# Patient Record
Sex: Female | Born: 1940 | Race: White | Hispanic: No | Marital: Single | Smoking: Current every day smoker
Health system: Southern US, Community
[De-identification: ages and names within clinical notes are randomized; demographics above are authoritative.]

## PROBLEM LIST (undated history)

## (undated) DIAGNOSIS — E0500 Thyrotoxicosis with diffuse goiter without thyrotoxic crisis or storm: Secondary | ICD-10-CM

## (undated) DIAGNOSIS — I10 Essential (primary) hypertension: Secondary | ICD-10-CM

## (undated) DIAGNOSIS — M818 Other osteoporosis without current pathological fracture: Secondary | ICD-10-CM

## (undated) HISTORY — PX: ABDOMINAL HYSTERECTOMY: SHX81

---

## 1998-11-18 ENCOUNTER — Other Ambulatory Visit: Admission: RE | Admit: 1998-11-18 | Discharge: 1998-11-18 | Payer: Self-pay | Admitting: Internal Medicine

## 2000-01-26 ENCOUNTER — Other Ambulatory Visit: Admission: RE | Admit: 2000-01-26 | Discharge: 2000-01-26 | Payer: Self-pay | Admitting: Internal Medicine

## 2001-02-17 ENCOUNTER — Other Ambulatory Visit: Admission: RE | Admit: 2001-02-17 | Discharge: 2001-02-17 | Payer: Self-pay | Admitting: Internal Medicine

## 2004-07-31 ENCOUNTER — Ambulatory Visit (HOSPITAL_COMMUNITY): Admission: RE | Admit: 2004-07-31 | Discharge: 2004-07-31 | Payer: Self-pay | Admitting: Gastroenterology

## 2004-07-31 ENCOUNTER — Encounter (INDEPENDENT_AMBULATORY_CARE_PROVIDER_SITE_OTHER): Payer: Self-pay | Admitting: *Deleted

## 2006-08-08 ENCOUNTER — Encounter (INDEPENDENT_AMBULATORY_CARE_PROVIDER_SITE_OTHER): Payer: Self-pay | Admitting: Specialist

## 2006-08-08 ENCOUNTER — Ambulatory Visit (HOSPITAL_COMMUNITY): Admission: RE | Admit: 2006-08-08 | Discharge: 2006-08-09 | Payer: Self-pay | Admitting: Obstetrics & Gynecology

## 2008-07-26 ENCOUNTER — Encounter: Admission: RE | Admit: 2008-07-26 | Discharge: 2008-07-26 | Payer: Self-pay | Admitting: Internal Medicine

## 2010-08-25 NOTE — Op Note (Signed)
NAME:  Sandra Brock, Sandra Brock                 ACCOUNT NO.:  [id number]   MEDICAL RECORD NO.:  [id number]          PATIENT TYPE:  AMB   LOCATION:  ENDO                         FACILITY:  [facility]   PHYSICIAN:  Bernette Redbird, M.D.   DATE OF BIRTH:  1940-11-01   DATE OF PROCEDURE:  07/31/2004  DATE OF DISCHARGE:                                 OPERATIVE REPORT   PROCEDURE:  Colonoscopy with biopsy.   ENDOSCOPIST:  Bernette Redbird, M.D.   INDICATIONS:  [age]-year-old for initial colon cancer screening exam.  No worrisome risk factors or symptoms.   FINDINGS:  Diminutive polyp.  Mild diverticulosis.   PROCEDURE:  The nature, purpose and risks of the procedure had been  discussed with the patient, who provided written consent.  Sedation was  fentanyl 60 mcg and Versed 6 mg IV without arrhythmias or desaturation.  The  Olympus adjustable-tension pediatric video colonoscope was advanced without  significant difficulty most of the way around the colon and then external  abdominal compression was used to enter the base of the cecum as identified  by visualization of the appendiceal orifice and pullback was then performed.  The quality of prep was excellent and it was felt that all areas were well-  seen.   There was a 3-mm sessile polyp at 35 cm removed by couple of cold biopsies.  No other polyps were seen and there was no evidence of cancer, colitis or  vascular malformations.  There was some mild scattered diverticulosis.  Retroflexion the rectum was unremarkable.   The patient tolerated the procedure well and there were no apparent  complications.   IMPRESSION:  1.  Solitary diminutive colon polyp removed as described above (211.3).  2.  Minimal diverticulosis.   PLAN:  Await pathology results.      RB/MEDQ  D:  07/31/2004  T:  08/01/2004  Job:  [id number]   cc:   Merlene Laughter. Renae Gloss, M.D.  [address]  [address]  [place]  [place] [address]  Fax: [phone]

## 2010-08-25 NOTE — Op Note (Signed)
NAME:  Sandra Brock, Sandra Brock                 ACCOUNT NO.:  [id number]   MEDICAL RECORD NO.:  [id number]          PATIENT TYPE:  AMB   LOCATION:  SDC                           FACILITY:  [facility]   PHYSICIAN:  Genia Del, M.D.DATE OF BIRTH:  1940/08/27   DATE OF PROCEDURE:  08/08/2006  DATE OF DISCHARGE:                               OPERATIVE REPORT   PREOPERATIVE DIAGNOSIS:  Symptomatic uterine prolapse, grade 2/3 and  cystocele grade 4/4.   POSTOPERATIVE DIAGNOSIS:  Symptomatic uterine prolapse, grade 2/3 and  cystocele grade 4/4.   PROCEDURE:  A vaginal hysterectomy with anterior repair.   SURGEON:  Dr. Genia Del   ASSISTANT:  Maxie Better, M.D.   ANESTHESIOLOGIST:  Dr. Cristela Blue.   PROCEDURE:  Under general anesthesia with endotracheal intubation the  patient is in lithotomy position.  She is prepped with Betadine on the  suprapubic, vulvar and vaginal areas and draped as usual.  The bladder  catheter with a Foley is put in place. The vaginal exam reveals of the  grade 2/3 uterine prolapse, small uterus.  No adnexal mass felt.  The  cystocele is grade 4/4 and no rectocele.  We put the weighted speculum  in place and the anterior retractor.  We grasped the cervix with two  tenaculums, infiltrate circumferentially at the junction of the cervix  and vaginal mucosa with lidocaine 1% with epinephrine, 20 mL are  injected. We then make a circumferential incision at that level with the  electrocautery cutting mode.  We then retract the vaginal mucosa from  the cervix posteriorly and opened the peritoneum with the [facility] scissors  at that level.  We changed the weighted speculum to a long narrow one  posteriorly.  We then go anteriorly and retract the vaginal mucosa from  the cervix at that level.  We opened the peritoneum with the [facility]  scissors and reposition the retractor to push the bladder away from the  surgical area.  We then used the curved Heaney on the left side.   We  clamped and sectioned with Mayo scissors and suture with a Heaney stitch  of Vicryl 0. The uterosacral and cardinal ligaments which are very  faint, we keep that on a hemostat.  We did the same thing on the right  side.  We then use the gyrus to cauterize the right uterine artery.  We  then sectioned with Jen Mow scissors.  We proceed the same way on the left  side.  We continued to go up the uterus, cauterizing with the gyrus and  sectioning with Metz scissors until the uterus is completely detached  and sent to pathology.  Hemostasis is good at all levels.  We do not see  the ovaries very well but no masses palpated and they are very high, so  the decision is taken to leave them behind. We verify hemostasis on all  pedicles, it is adequate.  We therefore continued with the anterior  repair.  We put Allis on the anterior vaginal mucosa, we used a [place]  to dissect just below the mucosa and section  in a straight line starting  at the vaginal cuff to about 2 cm of the urethral meatus. We used [employer]'  as we go along. We then used the scalpel and then the [facility] to release  the mucosa from the bladder.  We descend the bladder all around.  We  then use a Vicryl 2-0 in a circular fashion to reduce the cystocele and  finished with Vicryl 2-0 from side-to-side to completely reduce the  cystocele.  We then resect the vaginal mucosa on each side with straight  scissors.  We closed the vaginal mucosa with a locked running stitch of  Vicryl 3-0. Hemostasis is good at all levels.  We then closed the  vaginal cuff with separate stitches of Vicryl 0 from anterior to  posterior. As we go along we attach the two uterosacral ligaments  together in the midline.  The vagina is completely closed.  Hemostasis  is good.  Note that a locked running suture of Vicryl 0 was done on the  posterior vaginal mucosa before proceeding with the cystocele repair. We  are satisfied with the cystocele reduction.   Hemostasis is good at all levels, no rectocele is present.  We therefore  removed all instruments.  We apply some Estrace in the vagina to help  healing. The estimated blood loss was 50 mL.  The counts of sponges and  instruments was complete. The patient was brought to recovery room in  good stable status.      Genia Del, M.D.  Electronically Signed     ML/MEDQ  D:  08/08/2006  T:  08/08/2006  Job:  [id number]

## 2011-04-17 DIAGNOSIS — Z1231 Encounter for screening mammogram for malignant neoplasm of breast: Secondary | ICD-10-CM | POA: Diagnosis not present

## 2011-04-24 DIAGNOSIS — R928 Other abnormal and inconclusive findings on diagnostic imaging of breast: Secondary | ICD-10-CM | POA: Diagnosis not present

## 2011-04-24 DIAGNOSIS — Z09 Encounter for follow-up examination after completed treatment for conditions other than malignant neoplasm: Secondary | ICD-10-CM | POA: Diagnosis not present

## 2011-08-15 ENCOUNTER — Other Ambulatory Visit (HOSPITAL_COMMUNITY): Payer: Self-pay | Admitting: Endocrinology

## 2011-08-15 DIAGNOSIS — E0590 Thyrotoxicosis, unspecified without thyrotoxic crisis or storm: Secondary | ICD-10-CM

## 2011-08-15 DIAGNOSIS — E0500 Thyrotoxicosis with diffuse goiter without thyrotoxic crisis or storm: Secondary | ICD-10-CM | POA: Diagnosis not present

## 2011-08-20 ENCOUNTER — Encounter (HOSPITAL_COMMUNITY)
Admission: RE | Admit: 2011-08-20 | Discharge: 2011-08-20 | Disposition: A | Discharge status: Home / Self Care | Payer: Medicare Other | Source: Ambulatory Visit | Attending: Endocrinology | Admitting: Endocrinology

## 2011-08-20 DIAGNOSIS — E041 Nontoxic single thyroid nodule: Secondary | ICD-10-CM | POA: Diagnosis not present

## 2011-08-20 DIAGNOSIS — E0590 Thyrotoxicosis, unspecified without thyrotoxic crisis or storm: Secondary | ICD-10-CM | POA: Diagnosis not present

## 2011-08-20 DIAGNOSIS — E0510 Thyrotoxicosis with toxic single thyroid nodule without thyrotoxic crisis or storm: Secondary | ICD-10-CM | POA: Insufficient documentation

## 2011-08-21 ENCOUNTER — Encounter (HOSPITAL_COMMUNITY)
Admission: RE | Admit: 2011-08-21 | Discharge: 2011-08-21 | Disposition: A | Discharge status: Home / Self Care | Payer: Medicare Other | Source: Ambulatory Visit | Attending: Endocrinology | Admitting: Endocrinology

## 2011-08-21 DIAGNOSIS — E0510 Thyrotoxicosis with toxic single thyroid nodule without thyrotoxic crisis or storm: Secondary | ICD-10-CM | POA: Diagnosis not present

## 2011-08-21 MED ORDER — SODIUM IODIDE I 131 CAPSULE
8.5000 | Freq: Once | INTRAVENOUS | Status: AC | PRN
  Administered 2011-08-20: 8.5 via ORAL

## 2011-08-21 MED ORDER — SODIUM PERTECHNETATE TC 99M INJECTION
10.3000 | Freq: Once | INTRAVENOUS | Status: AC | PRN
  Administered 2011-08-21: 10 via INTRAVENOUS

## 2011-08-23 ENCOUNTER — Other Ambulatory Visit (HOSPITAL_COMMUNITY): Payer: Self-pay | Admitting: Endocrinology

## 2011-08-23 DIAGNOSIS — E0500 Thyrotoxicosis with diffuse goiter without thyrotoxic crisis or storm: Secondary | ICD-10-CM

## 2011-08-30 ENCOUNTER — Ambulatory Visit (HOSPITAL_COMMUNITY): Payer: Medicare Other

## 2011-09-13 DIAGNOSIS — E061 Subacute thyroiditis: Secondary | ICD-10-CM | POA: Diagnosis not present

## 2011-09-13 DIAGNOSIS — Z79899 Other long term (current) drug therapy: Secondary | ICD-10-CM | POA: Diagnosis not present

## 2011-09-13 DIAGNOSIS — Z0000 Encounter for general adult medical examination without abnormal findings: Secondary | ICD-10-CM | POA: Diagnosis not present

## 2011-09-13 DIAGNOSIS — I10 Essential (primary) hypertension: Secondary | ICD-10-CM | POA: Diagnosis not present

## 2011-09-13 DIAGNOSIS — E559 Vitamin D deficiency, unspecified: Secondary | ICD-10-CM | POA: Diagnosis not present

## 2011-09-13 DIAGNOSIS — R7309 Other abnormal glucose: Secondary | ICD-10-CM | POA: Diagnosis not present

## 2011-09-14 ENCOUNTER — Encounter (HOSPITAL_COMMUNITY): Payer: Self-pay

## 2011-09-14 ENCOUNTER — Encounter (HOSPITAL_COMMUNITY)
Admission: RE | Admit: 2011-09-14 | Discharge: 2011-09-14 | Disposition: A | Discharge status: Home / Self Care | Payer: Medicare Other | Source: Ambulatory Visit | Attending: Endocrinology | Admitting: Endocrinology

## 2011-09-14 DIAGNOSIS — E0590 Thyrotoxicosis, unspecified without thyrotoxic crisis or storm: Secondary | ICD-10-CM | POA: Diagnosis not present

## 2011-09-14 DIAGNOSIS — E0510 Thyrotoxicosis with toxic single thyroid nodule without thyrotoxic crisis or storm: Secondary | ICD-10-CM | POA: Insufficient documentation

## 2011-09-14 DIAGNOSIS — E041 Nontoxic single thyroid nodule: Secondary | ICD-10-CM | POA: Diagnosis not present

## 2011-09-14 DIAGNOSIS — E0500 Thyrotoxicosis with diffuse goiter without thyrotoxic crisis or storm: Secondary | ICD-10-CM

## 2011-09-14 HISTORY — DX: Thyrotoxicosis with diffuse goiter without thyrotoxic crisis or storm: E05.00

## 2011-09-14 MED ORDER — SODIUM IODIDE I 131 CAPSULE
29.9000 | Freq: Once | INTRAVENOUS | Status: AC | PRN
  Administered 2011-09-14: 29.9 via ORAL

## 2011-09-20 DIAGNOSIS — E0500 Thyrotoxicosis with diffuse goiter without thyrotoxic crisis or storm: Secondary | ICD-10-CM | POA: Diagnosis not present

## 2011-11-21 DIAGNOSIS — E0500 Thyrotoxicosis with diffuse goiter without thyrotoxic crisis or storm: Secondary | ICD-10-CM | POA: Diagnosis not present

## 2011-11-23 DIAGNOSIS — E0500 Thyrotoxicosis with diffuse goiter without thyrotoxic crisis or storm: Secondary | ICD-10-CM | POA: Diagnosis not present

## 2012-01-25 DIAGNOSIS — E0500 Thyrotoxicosis with diffuse goiter without thyrotoxic crisis or storm: Secondary | ICD-10-CM | POA: Diagnosis not present

## 2012-02-04 DIAGNOSIS — H2510 Age-related nuclear cataract, unspecified eye: Secondary | ICD-10-CM | POA: Diagnosis not present

## 2012-03-14 DIAGNOSIS — Z79899 Other long term (current) drug therapy: Secondary | ICD-10-CM | POA: Diagnosis not present

## 2012-03-14 DIAGNOSIS — M6281 Muscle weakness (generalized): Secondary | ICD-10-CM | POA: Diagnosis not present

## 2012-03-14 DIAGNOSIS — I10 Essential (primary) hypertension: Secondary | ICD-10-CM | POA: Diagnosis not present

## 2012-03-14 DIAGNOSIS — E0520 Thyrotoxicosis with toxic multinodular goiter without thyrotoxic crisis or storm: Secondary | ICD-10-CM | POA: Diagnosis not present

## 2012-05-05 DIAGNOSIS — Z1231 Encounter for screening mammogram for malignant neoplasm of breast: Secondary | ICD-10-CM | POA: Diagnosis not present

## 2012-05-26 DIAGNOSIS — E0500 Thyrotoxicosis with diffuse goiter without thyrotoxic crisis or storm: Secondary | ICD-10-CM | POA: Diagnosis not present

## 2012-08-25 DIAGNOSIS — M25559 Pain in unspecified hip: Secondary | ICD-10-CM | POA: Diagnosis not present

## 2012-08-25 DIAGNOSIS — Q762 Congenital spondylolisthesis: Secondary | ICD-10-CM | POA: Diagnosis not present

## 2012-08-25 DIAGNOSIS — IMO0002 Reserved for concepts with insufficient information to code with codable children: Secondary | ICD-10-CM | POA: Diagnosis not present

## 2012-08-25 DIAGNOSIS — M999 Biomechanical lesion, unspecified: Secondary | ICD-10-CM | POA: Diagnosis not present

## 2012-08-25 DIAGNOSIS — M5137 Other intervertebral disc degeneration, lumbosacral region: Secondary | ICD-10-CM | POA: Diagnosis not present

## 2012-08-26 DIAGNOSIS — M999 Biomechanical lesion, unspecified: Secondary | ICD-10-CM | POA: Diagnosis not present

## 2012-08-26 DIAGNOSIS — Q762 Congenital spondylolisthesis: Secondary | ICD-10-CM | POA: Diagnosis not present

## 2012-08-26 DIAGNOSIS — M5137 Other intervertebral disc degeneration, lumbosacral region: Secondary | ICD-10-CM | POA: Diagnosis not present

## 2012-08-26 DIAGNOSIS — M25559 Pain in unspecified hip: Secondary | ICD-10-CM | POA: Diagnosis not present

## 2012-08-26 DIAGNOSIS — IMO0002 Reserved for concepts with insufficient information to code with codable children: Secondary | ICD-10-CM | POA: Diagnosis not present

## 2012-08-27 DIAGNOSIS — M5137 Other intervertebral disc degeneration, lumbosacral region: Secondary | ICD-10-CM | POA: Diagnosis not present

## 2012-08-27 DIAGNOSIS — IMO0002 Reserved for concepts with insufficient information to code with codable children: Secondary | ICD-10-CM | POA: Diagnosis not present

## 2012-08-27 DIAGNOSIS — M25559 Pain in unspecified hip: Secondary | ICD-10-CM | POA: Diagnosis not present

## 2012-08-27 DIAGNOSIS — M999 Biomechanical lesion, unspecified: Secondary | ICD-10-CM | POA: Diagnosis not present

## 2012-08-27 DIAGNOSIS — Q762 Congenital spondylolisthesis: Secondary | ICD-10-CM | POA: Diagnosis not present

## 2012-08-29 DIAGNOSIS — Q762 Congenital spondylolisthesis: Secondary | ICD-10-CM | POA: Diagnosis not present

## 2012-08-29 DIAGNOSIS — IMO0002 Reserved for concepts with insufficient information to code with codable children: Secondary | ICD-10-CM | POA: Diagnosis not present

## 2012-08-29 DIAGNOSIS — M999 Biomechanical lesion, unspecified: Secondary | ICD-10-CM | POA: Diagnosis not present

## 2012-08-29 DIAGNOSIS — M5137 Other intervertebral disc degeneration, lumbosacral region: Secondary | ICD-10-CM | POA: Diagnosis not present

## 2012-08-29 DIAGNOSIS — M25559 Pain in unspecified hip: Secondary | ICD-10-CM | POA: Diagnosis not present

## 2012-09-03 DIAGNOSIS — M5137 Other intervertebral disc degeneration, lumbosacral region: Secondary | ICD-10-CM | POA: Diagnosis not present

## 2012-09-03 DIAGNOSIS — M25559 Pain in unspecified hip: Secondary | ICD-10-CM | POA: Diagnosis not present

## 2012-09-03 DIAGNOSIS — Q762 Congenital spondylolisthesis: Secondary | ICD-10-CM | POA: Diagnosis not present

## 2012-09-03 DIAGNOSIS — IMO0002 Reserved for concepts with insufficient information to code with codable children: Secondary | ICD-10-CM | POA: Diagnosis not present

## 2012-09-03 DIAGNOSIS — M999 Biomechanical lesion, unspecified: Secondary | ICD-10-CM | POA: Diagnosis not present

## 2012-09-05 DIAGNOSIS — IMO0002 Reserved for concepts with insufficient information to code with codable children: Secondary | ICD-10-CM | POA: Diagnosis not present

## 2012-09-05 DIAGNOSIS — M5137 Other intervertebral disc degeneration, lumbosacral region: Secondary | ICD-10-CM | POA: Diagnosis not present

## 2012-09-05 DIAGNOSIS — M999 Biomechanical lesion, unspecified: Secondary | ICD-10-CM | POA: Diagnosis not present

## 2012-09-05 DIAGNOSIS — Q762 Congenital spondylolisthesis: Secondary | ICD-10-CM | POA: Diagnosis not present

## 2012-09-05 DIAGNOSIS — M25559 Pain in unspecified hip: Secondary | ICD-10-CM | POA: Diagnosis not present

## 2012-09-08 DIAGNOSIS — M25559 Pain in unspecified hip: Secondary | ICD-10-CM | POA: Diagnosis not present

## 2012-09-08 DIAGNOSIS — M5137 Other intervertebral disc degeneration, lumbosacral region: Secondary | ICD-10-CM | POA: Diagnosis not present

## 2012-09-08 DIAGNOSIS — Q762 Congenital spondylolisthesis: Secondary | ICD-10-CM | POA: Diagnosis not present

## 2012-09-08 DIAGNOSIS — IMO0002 Reserved for concepts with insufficient information to code with codable children: Secondary | ICD-10-CM | POA: Diagnosis not present

## 2012-09-08 DIAGNOSIS — M999 Biomechanical lesion, unspecified: Secondary | ICD-10-CM | POA: Diagnosis not present

## 2012-09-09 DIAGNOSIS — M5137 Other intervertebral disc degeneration, lumbosacral region: Secondary | ICD-10-CM | POA: Diagnosis not present

## 2012-09-09 DIAGNOSIS — M25559 Pain in unspecified hip: Secondary | ICD-10-CM | POA: Diagnosis not present

## 2012-09-09 DIAGNOSIS — Q762 Congenital spondylolisthesis: Secondary | ICD-10-CM | POA: Diagnosis not present

## 2012-09-09 DIAGNOSIS — M999 Biomechanical lesion, unspecified: Secondary | ICD-10-CM | POA: Diagnosis not present

## 2012-09-09 DIAGNOSIS — IMO0002 Reserved for concepts with insufficient information to code with codable children: Secondary | ICD-10-CM | POA: Diagnosis not present

## 2012-09-10 DIAGNOSIS — M25559 Pain in unspecified hip: Secondary | ICD-10-CM | POA: Diagnosis not present

## 2012-09-10 DIAGNOSIS — M5137 Other intervertebral disc degeneration, lumbosacral region: Secondary | ICD-10-CM | POA: Diagnosis not present

## 2012-09-10 DIAGNOSIS — IMO0002 Reserved for concepts with insufficient information to code with codable children: Secondary | ICD-10-CM | POA: Diagnosis not present

## 2012-09-10 DIAGNOSIS — Q762 Congenital spondylolisthesis: Secondary | ICD-10-CM | POA: Diagnosis not present

## 2012-09-10 DIAGNOSIS — M999 Biomechanical lesion, unspecified: Secondary | ICD-10-CM | POA: Diagnosis not present

## 2012-09-15 DIAGNOSIS — IMO0002 Reserved for concepts with insufficient information to code with codable children: Secondary | ICD-10-CM | POA: Diagnosis not present

## 2012-09-15 DIAGNOSIS — Q762 Congenital spondylolisthesis: Secondary | ICD-10-CM | POA: Diagnosis not present

## 2012-09-15 DIAGNOSIS — M5137 Other intervertebral disc degeneration, lumbosacral region: Secondary | ICD-10-CM | POA: Diagnosis not present

## 2012-09-15 DIAGNOSIS — M25559 Pain in unspecified hip: Secondary | ICD-10-CM | POA: Diagnosis not present

## 2012-09-15 DIAGNOSIS — M999 Biomechanical lesion, unspecified: Secondary | ICD-10-CM | POA: Diagnosis not present

## 2012-09-16 DIAGNOSIS — M5137 Other intervertebral disc degeneration, lumbosacral region: Secondary | ICD-10-CM | POA: Diagnosis not present

## 2012-09-16 DIAGNOSIS — IMO0002 Reserved for concepts with insufficient information to code with codable children: Secondary | ICD-10-CM | POA: Diagnosis not present

## 2012-09-16 DIAGNOSIS — M999 Biomechanical lesion, unspecified: Secondary | ICD-10-CM | POA: Diagnosis not present

## 2012-09-16 DIAGNOSIS — Q762 Congenital spondylolisthesis: Secondary | ICD-10-CM | POA: Diagnosis not present

## 2012-09-16 DIAGNOSIS — M25559 Pain in unspecified hip: Secondary | ICD-10-CM | POA: Diagnosis not present

## 2012-09-17 DIAGNOSIS — IMO0002 Reserved for concepts with insufficient information to code with codable children: Secondary | ICD-10-CM | POA: Diagnosis not present

## 2012-09-17 DIAGNOSIS — M999 Biomechanical lesion, unspecified: Secondary | ICD-10-CM | POA: Diagnosis not present

## 2012-09-17 DIAGNOSIS — Q762 Congenital spondylolisthesis: Secondary | ICD-10-CM | POA: Diagnosis not present

## 2012-09-17 DIAGNOSIS — M5137 Other intervertebral disc degeneration, lumbosacral region: Secondary | ICD-10-CM | POA: Diagnosis not present

## 2012-09-17 DIAGNOSIS — M25559 Pain in unspecified hip: Secondary | ICD-10-CM | POA: Diagnosis not present

## 2012-09-22 DIAGNOSIS — M25559 Pain in unspecified hip: Secondary | ICD-10-CM | POA: Diagnosis not present

## 2012-09-22 DIAGNOSIS — M5137 Other intervertebral disc degeneration, lumbosacral region: Secondary | ICD-10-CM | POA: Diagnosis not present

## 2012-09-22 DIAGNOSIS — Q762 Congenital spondylolisthesis: Secondary | ICD-10-CM | POA: Diagnosis not present

## 2012-09-22 DIAGNOSIS — M999 Biomechanical lesion, unspecified: Secondary | ICD-10-CM | POA: Diagnosis not present

## 2012-09-22 DIAGNOSIS — IMO0002 Reserved for concepts with insufficient information to code with codable children: Secondary | ICD-10-CM | POA: Diagnosis not present

## 2012-09-25 DIAGNOSIS — Q762 Congenital spondylolisthesis: Secondary | ICD-10-CM | POA: Diagnosis not present

## 2012-09-25 DIAGNOSIS — M5137 Other intervertebral disc degeneration, lumbosacral region: Secondary | ICD-10-CM | POA: Diagnosis not present

## 2012-09-25 DIAGNOSIS — M999 Biomechanical lesion, unspecified: Secondary | ICD-10-CM | POA: Diagnosis not present

## 2012-09-25 DIAGNOSIS — IMO0002 Reserved for concepts with insufficient information to code with codable children: Secondary | ICD-10-CM | POA: Diagnosis not present

## 2012-09-25 DIAGNOSIS — M25559 Pain in unspecified hip: Secondary | ICD-10-CM | POA: Diagnosis not present

## 2012-09-29 DIAGNOSIS — M5137 Other intervertebral disc degeneration, lumbosacral region: Secondary | ICD-10-CM | POA: Diagnosis not present

## 2012-09-29 DIAGNOSIS — M25559 Pain in unspecified hip: Secondary | ICD-10-CM | POA: Diagnosis not present

## 2012-09-29 DIAGNOSIS — Q762 Congenital spondylolisthesis: Secondary | ICD-10-CM | POA: Diagnosis not present

## 2012-09-29 DIAGNOSIS — M999 Biomechanical lesion, unspecified: Secondary | ICD-10-CM | POA: Diagnosis not present

## 2012-09-29 DIAGNOSIS — IMO0002 Reserved for concepts with insufficient information to code with codable children: Secondary | ICD-10-CM | POA: Diagnosis not present

## 2012-10-02 DIAGNOSIS — M5137 Other intervertebral disc degeneration, lumbosacral region: Secondary | ICD-10-CM | POA: Diagnosis not present

## 2012-10-02 DIAGNOSIS — Q762 Congenital spondylolisthesis: Secondary | ICD-10-CM | POA: Diagnosis not present

## 2012-10-02 DIAGNOSIS — M999 Biomechanical lesion, unspecified: Secondary | ICD-10-CM | POA: Diagnosis not present

## 2012-10-02 DIAGNOSIS — IMO0002 Reserved for concepts with insufficient information to code with codable children: Secondary | ICD-10-CM | POA: Diagnosis not present

## 2012-10-02 DIAGNOSIS — M25559 Pain in unspecified hip: Secondary | ICD-10-CM | POA: Diagnosis not present

## 2012-10-07 DIAGNOSIS — IMO0002 Reserved for concepts with insufficient information to code with codable children: Secondary | ICD-10-CM | POA: Diagnosis not present

## 2012-10-07 DIAGNOSIS — M25559 Pain in unspecified hip: Secondary | ICD-10-CM | POA: Diagnosis not present

## 2012-10-07 DIAGNOSIS — M999 Biomechanical lesion, unspecified: Secondary | ICD-10-CM | POA: Diagnosis not present

## 2012-10-07 DIAGNOSIS — Q762 Congenital spondylolisthesis: Secondary | ICD-10-CM | POA: Diagnosis not present

## 2012-10-07 DIAGNOSIS — M5137 Other intervertebral disc degeneration, lumbosacral region: Secondary | ICD-10-CM | POA: Diagnosis not present

## 2012-10-13 DIAGNOSIS — M999 Biomechanical lesion, unspecified: Secondary | ICD-10-CM | POA: Diagnosis not present

## 2012-10-13 DIAGNOSIS — M25559 Pain in unspecified hip: Secondary | ICD-10-CM | POA: Diagnosis not present

## 2012-10-13 DIAGNOSIS — M5137 Other intervertebral disc degeneration, lumbosacral region: Secondary | ICD-10-CM | POA: Diagnosis not present

## 2012-10-13 DIAGNOSIS — IMO0002 Reserved for concepts with insufficient information to code with codable children: Secondary | ICD-10-CM | POA: Diagnosis not present

## 2012-10-13 DIAGNOSIS — Q762 Congenital spondylolisthesis: Secondary | ICD-10-CM | POA: Diagnosis not present

## 2012-10-22 DIAGNOSIS — M999 Biomechanical lesion, unspecified: Secondary | ICD-10-CM | POA: Diagnosis not present

## 2012-10-22 DIAGNOSIS — IMO0002 Reserved for concepts with insufficient information to code with codable children: Secondary | ICD-10-CM | POA: Diagnosis not present

## 2012-10-22 DIAGNOSIS — Q762 Congenital spondylolisthesis: Secondary | ICD-10-CM | POA: Diagnosis not present

## 2012-10-22 DIAGNOSIS — M5137 Other intervertebral disc degeneration, lumbosacral region: Secondary | ICD-10-CM | POA: Diagnosis not present

## 2012-10-22 DIAGNOSIS — M25559 Pain in unspecified hip: Secondary | ICD-10-CM | POA: Diagnosis not present

## 2012-11-10 DIAGNOSIS — IMO0002 Reserved for concepts with insufficient information to code with codable children: Secondary | ICD-10-CM | POA: Diagnosis not present

## 2012-11-10 DIAGNOSIS — Q762 Congenital spondylolisthesis: Secondary | ICD-10-CM | POA: Diagnosis not present

## 2012-11-10 DIAGNOSIS — M25559 Pain in unspecified hip: Secondary | ICD-10-CM | POA: Diagnosis not present

## 2012-11-10 DIAGNOSIS — M999 Biomechanical lesion, unspecified: Secondary | ICD-10-CM | POA: Diagnosis not present

## 2012-11-10 DIAGNOSIS — M5137 Other intervertebral disc degeneration, lumbosacral region: Secondary | ICD-10-CM | POA: Diagnosis not present

## 2013-01-06 DIAGNOSIS — E061 Subacute thyroiditis: Secondary | ICD-10-CM | POA: Diagnosis not present

## 2013-01-06 DIAGNOSIS — Z0000 Encounter for general adult medical examination without abnormal findings: Secondary | ICD-10-CM | POA: Diagnosis not present

## 2013-01-06 DIAGNOSIS — E785 Hyperlipidemia, unspecified: Secondary | ICD-10-CM | POA: Diagnosis not present

## 2013-01-06 DIAGNOSIS — I10 Essential (primary) hypertension: Secondary | ICD-10-CM | POA: Diagnosis not present

## 2013-01-06 DIAGNOSIS — M6281 Muscle weakness (generalized): Secondary | ICD-10-CM | POA: Diagnosis not present

## 2013-01-06 DIAGNOSIS — R7309 Other abnormal glucose: Secondary | ICD-10-CM | POA: Diagnosis not present

## 2013-01-07 DIAGNOSIS — R7309 Other abnormal glucose: Secondary | ICD-10-CM | POA: Diagnosis not present

## 2013-01-07 DIAGNOSIS — I10 Essential (primary) hypertension: Secondary | ICD-10-CM | POA: Diagnosis not present

## 2013-01-07 DIAGNOSIS — Z0000 Encounter for general adult medical examination without abnormal findings: Secondary | ICD-10-CM | POA: Diagnosis not present

## 2013-01-07 DIAGNOSIS — M810 Age-related osteoporosis without current pathological fracture: Secondary | ICD-10-CM | POA: Diagnosis not present

## 2013-05-07 DIAGNOSIS — Z79899 Other long term (current) drug therapy: Secondary | ICD-10-CM | POA: Diagnosis not present

## 2013-05-07 DIAGNOSIS — M810 Age-related osteoporosis without current pathological fracture: Secondary | ICD-10-CM | POA: Diagnosis not present

## 2013-05-07 DIAGNOSIS — I10 Essential (primary) hypertension: Secondary | ICD-10-CM | POA: Diagnosis not present

## 2013-05-14 DIAGNOSIS — Z1231 Encounter for screening mammogram for malignant neoplasm of breast: Secondary | ICD-10-CM | POA: Diagnosis not present

## 2013-07-01 ENCOUNTER — Other Ambulatory Visit: Payer: Self-pay | Admitting: Endocrinology

## 2013-07-01 DIAGNOSIS — R3589 Other polyuria: Secondary | ICD-10-CM | POA: Diagnosis not present

## 2013-07-01 DIAGNOSIS — E0500 Thyrotoxicosis with diffuse goiter without thyrotoxic crisis or storm: Secondary | ICD-10-CM | POA: Diagnosis not present

## 2013-07-01 DIAGNOSIS — R358 Other polyuria: Secondary | ICD-10-CM | POA: Diagnosis not present

## 2013-07-01 DIAGNOSIS — R221 Localized swelling, mass and lump, neck: Secondary | ICD-10-CM

## 2013-07-01 DIAGNOSIS — R599 Enlarged lymph nodes, unspecified: Secondary | ICD-10-CM | POA: Diagnosis not present

## 2013-07-02 ENCOUNTER — Ambulatory Visit
Admission: RE | Admit: 2013-07-02 | Discharge: 2013-07-02 | Disposition: A | Discharge status: Home / Self Care | Payer: Medicare Other | Source: Ambulatory Visit | Attending: Endocrinology | Admitting: Endocrinology

## 2013-07-02 DIAGNOSIS — R221 Localized swelling, mass and lump, neck: Secondary | ICD-10-CM

## 2013-07-02 DIAGNOSIS — E041 Nontoxic single thyroid nodule: Secondary | ICD-10-CM | POA: Diagnosis not present

## 2013-07-02 MED ORDER — IOHEXOL 300 MG/ML  SOLN
75.0000 mL | Freq: Once | INTRAMUSCULAR | Status: AC | PRN
  Administered 2013-07-02: 75 mL via INTRAVENOUS

## 2013-07-29 DIAGNOSIS — L821 Other seborrheic keratosis: Secondary | ICD-10-CM | POA: Diagnosis not present

## 2013-07-29 DIAGNOSIS — L820 Inflamed seborrheic keratosis: Secondary | ICD-10-CM | POA: Diagnosis not present

## 2013-07-29 DIAGNOSIS — L819 Disorder of pigmentation, unspecified: Secondary | ICD-10-CM | POA: Diagnosis not present

## 2013-07-29 DIAGNOSIS — D1801 Hemangioma of skin and subcutaneous tissue: Secondary | ICD-10-CM | POA: Diagnosis not present

## 2014-01-07 ENCOUNTER — Other Ambulatory Visit: Payer: Self-pay | Admitting: Internal Medicine

## 2014-01-07 DIAGNOSIS — Z139 Encounter for screening, unspecified: Secondary | ICD-10-CM

## 2014-01-07 DIAGNOSIS — Z0001 Encounter for general adult medical examination with abnormal findings: Secondary | ICD-10-CM | POA: Diagnosis not present

## 2014-01-07 DIAGNOSIS — I10 Essential (primary) hypertension: Secondary | ICD-10-CM | POA: Diagnosis not present

## 2014-01-07 DIAGNOSIS — E0500 Thyrotoxicosis with diffuse goiter without thyrotoxic crisis or storm: Secondary | ICD-10-CM | POA: Diagnosis not present

## 2014-01-07 DIAGNOSIS — F17200 Nicotine dependence, unspecified, uncomplicated: Secondary | ICD-10-CM | POA: Diagnosis not present

## 2014-01-07 DIAGNOSIS — M810 Age-related osteoporosis without current pathological fracture: Secondary | ICD-10-CM | POA: Diagnosis not present

## 2014-01-20 ENCOUNTER — Ambulatory Visit
Admission: RE | Admit: 2014-01-20 | Discharge: 2014-01-20 | Disposition: A | Discharge status: Home / Self Care | Payer: Medicare Other | Source: Ambulatory Visit | Attending: Internal Medicine | Admitting: Internal Medicine

## 2014-01-20 DIAGNOSIS — Z139 Encounter for screening, unspecified: Secondary | ICD-10-CM

## 2014-02-09 DIAGNOSIS — H43813 Vitreous degeneration, bilateral: Secondary | ICD-10-CM | POA: Diagnosis not present

## 2014-02-09 DIAGNOSIS — H25813 Combined forms of age-related cataract, bilateral: Secondary | ICD-10-CM | POA: Diagnosis not present

## 2014-05-18 DIAGNOSIS — Z1231 Encounter for screening mammogram for malignant neoplasm of breast: Secondary | ICD-10-CM | POA: Diagnosis not present

## 2014-07-01 DIAGNOSIS — E0590 Thyrotoxicosis, unspecified without thyrotoxic crisis or storm: Secondary | ICD-10-CM | POA: Diagnosis not present

## 2014-09-29 ENCOUNTER — Encounter (HOSPITAL_COMMUNITY): Payer: Self-pay

## 2014-09-29 ENCOUNTER — Emergency Department (HOSPITAL_COMMUNITY): Payer: Medicare Other

## 2014-09-29 ENCOUNTER — Emergency Department (HOSPITAL_COMMUNITY)
Admission: EM | Admit: 2014-09-29 | Discharge: 2014-09-29 | Disposition: A | Discharge status: Home / Self Care | Payer: Medicare Other | Attending: Emergency Medicine | Admitting: Emergency Medicine

## 2014-09-29 DIAGNOSIS — Z7982 Long term (current) use of aspirin: Secondary | ICD-10-CM | POA: Insufficient documentation

## 2014-09-29 DIAGNOSIS — M545 Low back pain: Secondary | ICD-10-CM | POA: Diagnosis present

## 2014-09-29 DIAGNOSIS — M818 Other osteoporosis without current pathological fracture: Secondary | ICD-10-CM | POA: Diagnosis not present

## 2014-09-29 DIAGNOSIS — I10 Essential (primary) hypertension: Secondary | ICD-10-CM | POA: Diagnosis not present

## 2014-09-29 DIAGNOSIS — M5442 Lumbago with sciatica, left side: Secondary | ICD-10-CM | POA: Diagnosis not present

## 2014-09-29 DIAGNOSIS — Z720 Tobacco use: Secondary | ICD-10-CM | POA: Insufficient documentation

## 2014-09-29 DIAGNOSIS — M25552 Pain in left hip: Secondary | ICD-10-CM | POA: Diagnosis not present

## 2014-09-29 DIAGNOSIS — M47816 Spondylosis without myelopathy or radiculopathy, lumbar region: Secondary | ICD-10-CM | POA: Diagnosis not present

## 2014-09-29 DIAGNOSIS — Z79899 Other long term (current) drug therapy: Secondary | ICD-10-CM | POA: Diagnosis not present

## 2014-09-29 DIAGNOSIS — Z8639 Personal history of other endocrine, nutritional and metabolic disease: Secondary | ICD-10-CM | POA: Insufficient documentation

## 2014-09-29 DIAGNOSIS — M549 Dorsalgia, unspecified: Secondary | ICD-10-CM

## 2014-09-29 HISTORY — DX: Essential (primary) hypertension: I10

## 2014-09-29 HISTORY — DX: Other osteoporosis without current pathological fracture: M81.8

## 2014-09-29 MED ORDER — HYDROCODONE-ACETAMINOPHEN 5-325 MG PO TABS
1.0000 | ORAL_TABLET | Freq: Once | ORAL | Status: AC
  Administered 2014-09-29: 1 via ORAL
  Filled 2014-09-29: qty 1

## 2014-09-29 MED ORDER — HYDROCODONE-ACETAMINOPHEN 5-325 MG PO TABS: 1.0000 | ORAL_TABLET | Freq: Four times a day (QID) | ORAL | Status: AC | PRN

## 2014-09-29 NOTE — ED Notes (Signed)
Patient states she began having back pain since yesterday and much worse today. Patient states she has been using heat and ice with no relief. Patient states that the pain radiates into the left leg

## 2014-09-29 NOTE — ED Provider Notes (Signed)
CSN: [id number]     Arrival date & time 09/29/14  1533 History   First MD Initiated Contact with Patient 09/29/14 1637     Chief Complaint  Patient presents with  . Back Pain     (Consider location/radiation/quality/duration/timing/severity/associated sxs/prior Treatment) HPI  Sandra Brock is a 74 y.o. female with PMH of HTN, osteoporosis presenting with left-sided back pain that started yesterday. Pain radiates down her left flank associated with left thigh numbness tingling. She denies weakness. Pain started after raking and washing windows yesterday. Pain worse with ambulation concerned positions. She has tried half Vicodin and heat and ice without significant relief. She denies weakness. No loss of control of bladder or bowel. She denies urinary symptoms. She denies abdominal pain. Pt with history of back pain and sees a chiropractor for her neck pain once monthly. But not recently. No falls. No blood thinners.   Past Medical History  Diagnosis Date  . Toxic goiter   . Hypertension   . Premature osteoporosis    Past Surgical History  Procedure Laterality Date  . Abdominal hysterectomy     Family History  Problem Relation Age of Onset  . Hypertension Mother   . Hypertension Father   . Dementia Father   . Diabetes Father    History  Substance Use Topics  . Smoking status: Current Every Day Smoker -- 0.50 packs/day    Types: Cigarettes  . Smokeless tobacco: Never Used  . Alcohol Use: No   OB History    No data available     Review of Systems 10 Systems reviewed and are negative for acute change except as noted in the HPI.    Allergies  Review of patient's allergies indicates no known allergies.  Home Medications   Prior to Admission medications   Medication Sig Start Date End Date Taking? Authorizing Provider  acetaminophen (TYLENOL) 500 MG tablet Take 1,000 mg by mouth every 4 (four) hours as needed for mild pain or headache.   Yes Historical Provider, MD   aspirin EC 81 MG tablet Take 81 mg by mouth daily.   Yes Historical Provider, MD  Calcium-Magnesium-Vitamin D (CALCIUM 1200+D3) 600-40-500 MG-MG-UNIT TB24 Take 1 tablet by mouth daily.   Yes Historical Provider, MD  telmisartan (MICARDIS) 40 MG tablet Take 40 mg by mouth daily.   Yes Historical Provider, MD  vitamin B-12 (CYANOCOBALAMIN) 1000 MCG tablet Take 1,000 mcg by mouth daily.   Yes Historical Provider, MD  HYDROcodone-acetaminophen (NORCO/VICODIN) 5-325 MG per tablet Take 1-2 tablets by mouth every 6 (six) hours as needed. 09/29/14   Al Corpus, PA-C   BP 175/87 mmHg  Pulse 78  Temp(Src) 97.6 F (36.4 C) (Oral)  Resp 16  Ht 5\' 6"  (1.676 m)  Wt 119 lb (53.978 kg)  BMI 19.22 kg/m2  SpO2 100% Physical Exam  Constitutional: She appears well-developed and well-nourished. No distress.  HENT:  Head: Normocephalic and atraumatic.  Eyes: Conjunctivae are normal. Right eye exhibits no discharge. Left eye exhibits no discharge.  Cardiovascular: Normal rate, regular rhythm and normal heart sounds.   Pulmonary/Chest: Effort normal and breath sounds normal. No respiratory distress. She has no wheezes.  Abdominal: Soft. Bowel sounds are normal. She exhibits no distension. There is no tenderness.  Musculoskeletal:  No midline back tenderness, step off or crepitus. Left sided lower back tenderness. No CVA tenderness.  Neurological: She is alert. Coordination normal.  Equal muscle tone. 5/5 strength in lower extremities. DTR equal and intact. Antalgic gait.  Skin: Skin is warm and dry. She is not diaphoretic.  Nursing note and vitals reviewed.   ED Course  Procedures (including critical care time) Labs Review Labs Reviewed - No data to display  Imaging Review Dg Lumbar Spine Complete  09/29/2014   CLINICAL DATA:  Low back pain without trauma.  EXAM: LUMBAR SPINE - COMPLETE 4+ VIEW  COMPARISON:  None.  FINDINGS: Five lumbar type vertebral bodies. Sacroiliac joints are symmetric. Mild  osteopenia. Vascular calcifications. Maintenance of vertebral body height. Multilevel spondylosis, including facet arthropathy at L4-5 and L5-S1. Anterolisthesis of L4 on L5 at 6 mm. Advanced degenerative disc disease at L1-2. Moderate degenerative disc disease at the lumbosacral junction.  IMPRESSION: Advanced spondylosis, without vertebral body height loss.  L4-5 anterolisthesis, favored to be degenerative.   Electronically Signed   By: Abigail Miyamoto M.D.   On: 09/29/2014 17:53   Dg Hip Unilat With Pelvis 2-3 Views Left  09/29/2014   CLINICAL DATA:  Left hip pain.  No history of recent trauma  EXAM: LEFT HIP (WITH PELVIS) 2-3 VIEWS  COMPARISON:  None.  FINDINGS: Frontal pelvis as well as frontal and lateral left hip images obtained. There is no fracture or dislocation. Hip joints appear symmetric and normal bilaterally. There is bony overgrowth overlying the left upper sacrum, felt to be of arthropathic etiology. There are phleboliths in the pelvis.  IMPRESSION: No fracture or dislocation. Hip joints appear symmetric and within normal limits bilaterally.   Electronically Signed   By: Lowella Grip III M.D.   On: 09/29/2014 17:53     EKG Interpretation None      MDM   Final diagnoses:  Back pain  Left-sided low back pain with left-sided sciatica   Patient with back pain. No loss of bowel or bladder control. No saddle anesthesia. No fever, night sweats, weight loss, h/o cancer. VSS. No neurological deficits and normal neuro exam. Patient ambulatory. No concern for cauda equina. Pt has no abdominal pain, symptoms or abdominal pain on exam. I doubt abdominal pathology. RICE protocol and pain medicine indicated and discussed with patient. Driving and sedation precautions provided. Patient is afebrile, nontoxic, and in no acute distress. Patient is appropriate for outpatient management and is stable for discharge.  Discussed return precautions with patient. Discussed all results and patient  verbalizes understanding and agrees with plan.  This is a shared patient. This patient was discussed with the physician who saw and evaluated the patient and agrees with the plan.   Al Corpus, PA-C 09/29/14 [place], MD 10/01/14 1047

## 2014-09-29 NOTE — Discharge Instructions (Signed)
Return to the emergency room with worsening of symptoms, new symptoms or with symptoms that are concerning , especially fevers, loss of control of bladder or bowels, numbness or tingling around genital region or anus, weakness. RICE: Rest, Ice (three cycles of 20 mins on, 75mns off at least twice a day), compression/brace, elevation. Heating pad works well for back pain. Norco for pain. Do not operate machinery, drive or drink alcohol while taking narcotics or muscle relaxers. Call to make follow up appointment with your primary care provider. Follow up with orthopedist if symptoms worsen or are persistent. Read below information and follow recommendations.  Back Injury Prevention Back injuries can be extremely painful and difficult to heal. After having one back injury, you are much more likely to experience another later on. It is important to learn how to avoid injuring or re-injuring your back. The following tips can help you to prevent a back injury. PHYSICAL FITNESS  Exercise regularly and try to develop good tone in your abdominal muscles. Your abdominal muscles provide a lot of the support needed by your back.  Do aerobic exercises (walking, jogging, biking, swimming) regularly.  Do exercises that increase balance and strength (tai chi, yoga) regularly. This can decrease your risk of falling and injuring your back.  Stretch before and after exercising.  Maintain a healthy weight. The more you weigh, the more stress is placed on your back. For every pound of weight, 10 times that amount of pressure is placed on the back. DIET  Talk to your caregiver about how much calcium and vitamin D you need per day. These nutrients help to prevent weakening of the bones (osteoporosis). Osteoporosis can cause broken (fractured) bones that lead to back pain.  Include good sources of calcium in your diet, such as dairy products, green, leafy vegetables, and products with calcium added  (fortified).  Include good sources of vitamin D in your diet, such as milk and foods that are fortified with vitamin D.  Consider taking a nutritional supplement or a multivitamin if needed.  Stop smoking if you smoke. POSTURE  Sit and stand up straight. Avoid leaning forward when you sit or hunching over when you stand.  Choose chairs with good low back (lumbar) support.  If you work at a desk, sit close to your work so you do not need to lean over. Keep your chin tucked in. Keep your neck drawn back and elbows bent at a right angle. Your arms should look like the letter "L."  Sit high and close to the steering wheel when you drive. Add a lumbar support to your car seat if needed.  Avoid sitting or standing in one position for too long. Take breaks to get up, stretch, and walk around at least once every hour. Take breaks if you are driving for long periods of time.  Sleep on your side with your knees slightly bent, or sleep on your back with a pillow under your knees. Do not sleep on your stomach. LIFTING, TWISTING, AND REACHING  Avoid heavy lifting, especially repetitive lifting. If you must do heavy lifting:  Stretch before lifting.  Work slowly.  Rest between lifts.  Use carts and dollies to move objects when possible.  Make several small trips instead of carrying 1 heavy load.  Ask for help when you need it.  Ask for help when moving big, awkward objects.  Follow these steps when lifting:  Stand with your feet shoulder-width apart.  Get as close to the object  as you can. Do not try to pick up heavy objects that are far from your body.  Use handles or lifting straps if they are available.  Bend at your knees. Squat down, but keep your heels off the floor.  Keep your shoulders pulled back, your chin tucked in, and your back straight.  Lift the object slowly, tightening the muscles in your legs, abdomen, and buttocks. Keep the object as close to the center of your  body as possible.  When you put a load down, use these same guidelines in reverse.  Do not:  Lift the object above your waist.  Twist at the waist while lifting or carrying a load. Move your feet if you need to turn, not your waist.  Bend over without bending at your knees.  Avoid reaching over your head, across a table, or for an object on a high surface. OTHER TIPS  Avoid wet floors and keep sidewalks clear of ice to prevent falls.  Do not sleep on a mattress that is too soft or too hard.  Keep items that are used frequently within easy reach.  Put heavier objects on shelves at waist level and lighter objects on lower or higher shelves.  Find ways to decrease your stress, such as exercise, massage, or relaxation techniques. Stress can build up in your muscles. Tense muscles are more vulnerable to injury.  Seek treatment for depression or anxiety if needed. These conditions can increase your risk of developing back pain. SEEK MEDICAL CARE IF:  You injure your back.  You have questions about diet, exercise, or other ways to prevent back injuries. MAKE SURE YOU:  Understand these instructions.  Will watch your condition.  Will get help right away if you are not doing well or get worse. Document Released: 05/03/2004 Document Revised: 06/18/2011 Document Reviewed: 05/07/2011 [facility] Patient Information 2015 [place], [place]. This information is not intended to replace advice given to you by your health care provider. Make sure you discuss any questions you have with your health care provider.

## 2014-09-30 DIAGNOSIS — M9904 Segmental and somatic dysfunction of sacral region: Secondary | ICD-10-CM | POA: Diagnosis not present

## 2014-09-30 DIAGNOSIS — M9903 Segmental and somatic dysfunction of lumbar region: Secondary | ICD-10-CM | POA: Diagnosis not present

## 2014-09-30 DIAGNOSIS — M5136 Other intervertebral disc degeneration, lumbar region: Secondary | ICD-10-CM | POA: Diagnosis not present

## 2014-09-30 DIAGNOSIS — M9905 Segmental and somatic dysfunction of pelvic region: Secondary | ICD-10-CM | POA: Diagnosis not present

## 2014-10-01 DIAGNOSIS — M9905 Segmental and somatic dysfunction of pelvic region: Secondary | ICD-10-CM | POA: Diagnosis not present

## 2014-10-01 DIAGNOSIS — M9904 Segmental and somatic dysfunction of sacral region: Secondary | ICD-10-CM | POA: Diagnosis not present

## 2014-10-01 DIAGNOSIS — M5136 Other intervertebral disc degeneration, lumbar region: Secondary | ICD-10-CM | POA: Diagnosis not present

## 2014-10-01 DIAGNOSIS — M9903 Segmental and somatic dysfunction of lumbar region: Secondary | ICD-10-CM | POA: Diagnosis not present

## 2014-10-04 DIAGNOSIS — M5136 Other intervertebral disc degeneration, lumbar region: Secondary | ICD-10-CM | POA: Diagnosis not present

## 2014-10-04 DIAGNOSIS — M9905 Segmental and somatic dysfunction of pelvic region: Secondary | ICD-10-CM | POA: Diagnosis not present

## 2014-10-04 DIAGNOSIS — M9904 Segmental and somatic dysfunction of sacral region: Secondary | ICD-10-CM | POA: Diagnosis not present

## 2014-10-04 DIAGNOSIS — M9903 Segmental and somatic dysfunction of lumbar region: Secondary | ICD-10-CM | POA: Diagnosis not present

## 2014-10-06 DIAGNOSIS — M9903 Segmental and somatic dysfunction of lumbar region: Secondary | ICD-10-CM | POA: Diagnosis not present

## 2014-10-06 DIAGNOSIS — M5136 Other intervertebral disc degeneration, lumbar region: Secondary | ICD-10-CM | POA: Diagnosis not present

## 2014-10-06 DIAGNOSIS — M9905 Segmental and somatic dysfunction of pelvic region: Secondary | ICD-10-CM | POA: Diagnosis not present

## 2014-10-06 DIAGNOSIS — M9904 Segmental and somatic dysfunction of sacral region: Secondary | ICD-10-CM | POA: Diagnosis not present

## 2014-10-13 DIAGNOSIS — M9905 Segmental and somatic dysfunction of pelvic region: Secondary | ICD-10-CM | POA: Diagnosis not present

## 2014-10-13 DIAGNOSIS — M9904 Segmental and somatic dysfunction of sacral region: Secondary | ICD-10-CM | POA: Diagnosis not present

## 2014-10-13 DIAGNOSIS — M5136 Other intervertebral disc degeneration, lumbar region: Secondary | ICD-10-CM | POA: Diagnosis not present

## 2014-10-13 DIAGNOSIS — M9903 Segmental and somatic dysfunction of lumbar region: Secondary | ICD-10-CM | POA: Diagnosis not present

## 2014-10-15 DIAGNOSIS — M5136 Other intervertebral disc degeneration, lumbar region: Secondary | ICD-10-CM | POA: Diagnosis not present

## 2014-10-15 DIAGNOSIS — M9903 Segmental and somatic dysfunction of lumbar region: Secondary | ICD-10-CM | POA: Diagnosis not present

## 2014-10-15 DIAGNOSIS — M9904 Segmental and somatic dysfunction of sacral region: Secondary | ICD-10-CM | POA: Diagnosis not present

## 2014-10-15 DIAGNOSIS — M9905 Segmental and somatic dysfunction of pelvic region: Secondary | ICD-10-CM | POA: Diagnosis not present

## 2014-10-25 DIAGNOSIS — M5136 Other intervertebral disc degeneration, lumbar region: Secondary | ICD-10-CM | POA: Diagnosis not present

## 2014-10-25 DIAGNOSIS — M9905 Segmental and somatic dysfunction of pelvic region: Secondary | ICD-10-CM | POA: Diagnosis not present

## 2014-10-25 DIAGNOSIS — M9904 Segmental and somatic dysfunction of sacral region: Secondary | ICD-10-CM | POA: Diagnosis not present

## 2014-10-25 DIAGNOSIS — M9903 Segmental and somatic dysfunction of lumbar region: Secondary | ICD-10-CM | POA: Diagnosis not present

## 2014-11-01 DIAGNOSIS — M5136 Other intervertebral disc degeneration, lumbar region: Secondary | ICD-10-CM | POA: Diagnosis not present

## 2014-11-01 DIAGNOSIS — M9905 Segmental and somatic dysfunction of pelvic region: Secondary | ICD-10-CM | POA: Diagnosis not present

## 2014-11-01 DIAGNOSIS — M9903 Segmental and somatic dysfunction of lumbar region: Secondary | ICD-10-CM | POA: Diagnosis not present

## 2014-11-01 DIAGNOSIS — M9904 Segmental and somatic dysfunction of sacral region: Secondary | ICD-10-CM | POA: Diagnosis not present

## 2014-11-15 DIAGNOSIS — M9904 Segmental and somatic dysfunction of sacral region: Secondary | ICD-10-CM | POA: Diagnosis not present

## 2014-11-15 DIAGNOSIS — M9905 Segmental and somatic dysfunction of pelvic region: Secondary | ICD-10-CM | POA: Diagnosis not present

## 2014-11-15 DIAGNOSIS — M9903 Segmental and somatic dysfunction of lumbar region: Secondary | ICD-10-CM | POA: Diagnosis not present

## 2014-11-15 DIAGNOSIS — M5136 Other intervertebral disc degeneration, lumbar region: Secondary | ICD-10-CM | POA: Diagnosis not present

## 2014-11-18 DIAGNOSIS — M9904 Segmental and somatic dysfunction of sacral region: Secondary | ICD-10-CM | POA: Diagnosis not present

## 2014-11-18 DIAGNOSIS — M9905 Segmental and somatic dysfunction of pelvic region: Secondary | ICD-10-CM | POA: Diagnosis not present

## 2014-11-18 DIAGNOSIS — M9903 Segmental and somatic dysfunction of lumbar region: Secondary | ICD-10-CM | POA: Diagnosis not present

## 2014-11-18 DIAGNOSIS — M5136 Other intervertebral disc degeneration, lumbar region: Secondary | ICD-10-CM | POA: Diagnosis not present

## 2014-11-19 DIAGNOSIS — M9903 Segmental and somatic dysfunction of lumbar region: Secondary | ICD-10-CM | POA: Diagnosis not present

## 2014-11-19 DIAGNOSIS — M9905 Segmental and somatic dysfunction of pelvic region: Secondary | ICD-10-CM | POA: Diagnosis not present

## 2014-11-19 DIAGNOSIS — M5136 Other intervertebral disc degeneration, lumbar region: Secondary | ICD-10-CM | POA: Diagnosis not present

## 2014-11-19 DIAGNOSIS — M9904 Segmental and somatic dysfunction of sacral region: Secondary | ICD-10-CM | POA: Diagnosis not present

## 2014-11-22 DIAGNOSIS — M9905 Segmental and somatic dysfunction of pelvic region: Secondary | ICD-10-CM | POA: Diagnosis not present

## 2014-11-22 DIAGNOSIS — M9903 Segmental and somatic dysfunction of lumbar region: Secondary | ICD-10-CM | POA: Diagnosis not present

## 2014-11-22 DIAGNOSIS — M5136 Other intervertebral disc degeneration, lumbar region: Secondary | ICD-10-CM | POA: Diagnosis not present

## 2014-11-22 DIAGNOSIS — M9904 Segmental and somatic dysfunction of sacral region: Secondary | ICD-10-CM | POA: Diagnosis not present

## 2014-11-25 DIAGNOSIS — M9904 Segmental and somatic dysfunction of sacral region: Secondary | ICD-10-CM | POA: Diagnosis not present

## 2014-11-25 DIAGNOSIS — M9903 Segmental and somatic dysfunction of lumbar region: Secondary | ICD-10-CM | POA: Diagnosis not present

## 2014-11-25 DIAGNOSIS — M9905 Segmental and somatic dysfunction of pelvic region: Secondary | ICD-10-CM | POA: Diagnosis not present

## 2014-11-25 DIAGNOSIS — M5136 Other intervertebral disc degeneration, lumbar region: Secondary | ICD-10-CM | POA: Diagnosis not present

## 2015-01-03 DIAGNOSIS — M5136 Other intervertebral disc degeneration, lumbar region: Secondary | ICD-10-CM | POA: Diagnosis not present

## 2015-01-03 DIAGNOSIS — M9904 Segmental and somatic dysfunction of sacral region: Secondary | ICD-10-CM | POA: Diagnosis not present

## 2015-01-03 DIAGNOSIS — M9905 Segmental and somatic dysfunction of pelvic region: Secondary | ICD-10-CM | POA: Diagnosis not present

## 2015-01-03 DIAGNOSIS — M9903 Segmental and somatic dysfunction of lumbar region: Secondary | ICD-10-CM | POA: Diagnosis not present

## 2015-01-05 DIAGNOSIS — M9903 Segmental and somatic dysfunction of lumbar region: Secondary | ICD-10-CM | POA: Diagnosis not present

## 2015-01-05 DIAGNOSIS — M9905 Segmental and somatic dysfunction of pelvic region: Secondary | ICD-10-CM | POA: Diagnosis not present

## 2015-01-05 DIAGNOSIS — M9904 Segmental and somatic dysfunction of sacral region: Secondary | ICD-10-CM | POA: Diagnosis not present

## 2015-01-05 DIAGNOSIS — M5136 Other intervertebral disc degeneration, lumbar region: Secondary | ICD-10-CM | POA: Diagnosis not present

## 2015-01-13 DIAGNOSIS — Z0001 Encounter for general adult medical examination with abnormal findings: Secondary | ICD-10-CM | POA: Diagnosis not present

## 2015-01-13 DIAGNOSIS — M810 Age-related osteoporosis without current pathological fracture: Secondary | ICD-10-CM | POA: Diagnosis not present

## 2015-01-13 DIAGNOSIS — I10 Essential (primary) hypertension: Secondary | ICD-10-CM | POA: Diagnosis not present

## 2015-01-13 DIAGNOSIS — E0500 Thyrotoxicosis with diffuse goiter without thyrotoxic crisis or storm: Secondary | ICD-10-CM | POA: Diagnosis not present

## 2015-01-19 DIAGNOSIS — I10 Essential (primary) hypertension: Secondary | ICD-10-CM | POA: Diagnosis not present

## 2015-01-19 DIAGNOSIS — J449 Chronic obstructive pulmonary disease, unspecified: Secondary | ICD-10-CM | POA: Diagnosis not present

## 2015-01-19 DIAGNOSIS — R739 Hyperglycemia, unspecified: Secondary | ICD-10-CM | POA: Diagnosis not present

## 2015-01-19 DIAGNOSIS — M810 Age-related osteoporosis without current pathological fracture: Secondary | ICD-10-CM | POA: Diagnosis not present

## 2015-01-19 DIAGNOSIS — F17200 Nicotine dependence, unspecified, uncomplicated: Secondary | ICD-10-CM | POA: Diagnosis not present

## 2015-02-23 DIAGNOSIS — M9905 Segmental and somatic dysfunction of pelvic region: Secondary | ICD-10-CM | POA: Diagnosis not present

## 2015-02-23 DIAGNOSIS — M9904 Segmental and somatic dysfunction of sacral region: Secondary | ICD-10-CM | POA: Diagnosis not present

## 2015-02-23 DIAGNOSIS — M5136 Other intervertebral disc degeneration, lumbar region: Secondary | ICD-10-CM | POA: Diagnosis not present

## 2015-02-23 DIAGNOSIS — M9903 Segmental and somatic dysfunction of lumbar region: Secondary | ICD-10-CM | POA: Diagnosis not present

## 2015-02-28 DIAGNOSIS — M9905 Segmental and somatic dysfunction of pelvic region: Secondary | ICD-10-CM | POA: Diagnosis not present

## 2015-02-28 DIAGNOSIS — M9904 Segmental and somatic dysfunction of sacral region: Secondary | ICD-10-CM | POA: Diagnosis not present

## 2015-02-28 DIAGNOSIS — M5136 Other intervertebral disc degeneration, lumbar region: Secondary | ICD-10-CM | POA: Diagnosis not present

## 2015-02-28 DIAGNOSIS — M9903 Segmental and somatic dysfunction of lumbar region: Secondary | ICD-10-CM | POA: Diagnosis not present

## 2015-03-02 DIAGNOSIS — R739 Hyperglycemia, unspecified: Secondary | ICD-10-CM | POA: Diagnosis not present

## 2015-03-02 DIAGNOSIS — R202 Paresthesia of skin: Secondary | ICD-10-CM | POA: Diagnosis not present

## 2015-03-02 DIAGNOSIS — I10 Essential (primary) hypertension: Secondary | ICD-10-CM | POA: Diagnosis not present

## 2015-03-10 DIAGNOSIS — I10 Essential (primary) hypertension: Secondary | ICD-10-CM | POA: Diagnosis not present

## 2015-03-14 DIAGNOSIS — M25511 Pain in right shoulder: Secondary | ICD-10-CM | POA: Diagnosis not present

## 2015-03-14 DIAGNOSIS — M9902 Segmental and somatic dysfunction of thoracic region: Secondary | ICD-10-CM | POA: Diagnosis not present

## 2015-03-14 DIAGNOSIS — M9901 Segmental and somatic dysfunction of cervical region: Secondary | ICD-10-CM | POA: Diagnosis not present

## 2015-03-14 DIAGNOSIS — M50322 Other cervical disc degeneration at C5-C6 level: Secondary | ICD-10-CM | POA: Diagnosis not present

## 2015-03-16 DIAGNOSIS — M9902 Segmental and somatic dysfunction of thoracic region: Secondary | ICD-10-CM | POA: Diagnosis not present

## 2015-03-16 DIAGNOSIS — M50322 Other cervical disc degeneration at C5-C6 level: Secondary | ICD-10-CM | POA: Diagnosis not present

## 2015-03-16 DIAGNOSIS — M25511 Pain in right shoulder: Secondary | ICD-10-CM | POA: Diagnosis not present

## 2015-03-16 DIAGNOSIS — M9901 Segmental and somatic dysfunction of cervical region: Secondary | ICD-10-CM | POA: Diagnosis not present

## 2015-03-17 DIAGNOSIS — I10 Essential (primary) hypertension: Secondary | ICD-10-CM | POA: Diagnosis not present

## 2015-03-21 DIAGNOSIS — M50322 Other cervical disc degeneration at C5-C6 level: Secondary | ICD-10-CM | POA: Diagnosis not present

## 2015-03-21 DIAGNOSIS — M9902 Segmental and somatic dysfunction of thoracic region: Secondary | ICD-10-CM | POA: Diagnosis not present

## 2015-03-21 DIAGNOSIS — M25511 Pain in right shoulder: Secondary | ICD-10-CM | POA: Diagnosis not present

## 2015-03-21 DIAGNOSIS — M9901 Segmental and somatic dysfunction of cervical region: Secondary | ICD-10-CM | POA: Diagnosis not present

## 2015-03-23 DIAGNOSIS — M9902 Segmental and somatic dysfunction of thoracic region: Secondary | ICD-10-CM | POA: Diagnosis not present

## 2015-03-23 DIAGNOSIS — M25511 Pain in right shoulder: Secondary | ICD-10-CM | POA: Diagnosis not present

## 2015-03-23 DIAGNOSIS — M50322 Other cervical disc degeneration at C5-C6 level: Secondary | ICD-10-CM | POA: Diagnosis not present

## 2015-03-23 DIAGNOSIS — M9901 Segmental and somatic dysfunction of cervical region: Secondary | ICD-10-CM | POA: Diagnosis not present

## 2015-03-28 DIAGNOSIS — M25511 Pain in right shoulder: Secondary | ICD-10-CM | POA: Diagnosis not present

## 2015-03-28 DIAGNOSIS — M9901 Segmental and somatic dysfunction of cervical region: Secondary | ICD-10-CM | POA: Diagnosis not present

## 2015-03-28 DIAGNOSIS — M9902 Segmental and somatic dysfunction of thoracic region: Secondary | ICD-10-CM | POA: Diagnosis not present

## 2015-03-28 DIAGNOSIS — M50322 Other cervical disc degeneration at C5-C6 level: Secondary | ICD-10-CM | POA: Diagnosis not present

## 2015-03-30 DIAGNOSIS — M25511 Pain in right shoulder: Secondary | ICD-10-CM | POA: Diagnosis not present

## 2015-03-30 DIAGNOSIS — M50322 Other cervical disc degeneration at C5-C6 level: Secondary | ICD-10-CM | POA: Diagnosis not present

## 2015-03-30 DIAGNOSIS — M9901 Segmental and somatic dysfunction of cervical region: Secondary | ICD-10-CM | POA: Diagnosis not present

## 2015-03-30 DIAGNOSIS — M9902 Segmental and somatic dysfunction of thoracic region: Secondary | ICD-10-CM | POA: Diagnosis not present

## 2015-04-11 DIAGNOSIS — M50322 Other cervical disc degeneration at C5-C6 level: Secondary | ICD-10-CM | POA: Diagnosis not present

## 2015-04-11 DIAGNOSIS — M9901 Segmental and somatic dysfunction of cervical region: Secondary | ICD-10-CM | POA: Diagnosis not present

## 2015-04-11 DIAGNOSIS — M9902 Segmental and somatic dysfunction of thoracic region: Secondary | ICD-10-CM | POA: Diagnosis not present

## 2015-04-11 DIAGNOSIS — M25511 Pain in right shoulder: Secondary | ICD-10-CM | POA: Diagnosis not present

## 2015-04-13 DIAGNOSIS — M9902 Segmental and somatic dysfunction of thoracic region: Secondary | ICD-10-CM | POA: Diagnosis not present

## 2015-04-13 DIAGNOSIS — M25511 Pain in right shoulder: Secondary | ICD-10-CM | POA: Diagnosis not present

## 2015-04-13 DIAGNOSIS — M9901 Segmental and somatic dysfunction of cervical region: Secondary | ICD-10-CM | POA: Diagnosis not present

## 2015-04-13 DIAGNOSIS — M50322 Other cervical disc degeneration at C5-C6 level: Secondary | ICD-10-CM | POA: Diagnosis not present

## 2015-05-23 DIAGNOSIS — Z1231 Encounter for screening mammogram for malignant neoplasm of breast: Secondary | ICD-10-CM | POA: Diagnosis not present

## 2015-06-16 DIAGNOSIS — E0590 Thyrotoxicosis, unspecified without thyrotoxic crisis or storm: Secondary | ICD-10-CM | POA: Diagnosis not present

## 2015-06-16 DIAGNOSIS — I10 Essential (primary) hypertension: Secondary | ICD-10-CM | POA: Diagnosis not present

## 2015-06-21 DIAGNOSIS — F17200 Nicotine dependence, unspecified, uncomplicated: Secondary | ICD-10-CM | POA: Diagnosis not present

## 2015-06-21 DIAGNOSIS — I10 Essential (primary) hypertension: Secondary | ICD-10-CM | POA: Diagnosis not present

## 2015-06-21 DIAGNOSIS — M810 Age-related osteoporosis without current pathological fracture: Secondary | ICD-10-CM | POA: Diagnosis not present

## 2015-06-21 DIAGNOSIS — E0590 Thyrotoxicosis, unspecified without thyrotoxic crisis or storm: Secondary | ICD-10-CM | POA: Diagnosis not present

## 2015-07-01 DIAGNOSIS — E890 Postprocedural hypothyroidism: Secondary | ICD-10-CM | POA: Diagnosis not present

## 2015-08-31 DIAGNOSIS — E890 Postprocedural hypothyroidism: Secondary | ICD-10-CM | POA: Diagnosis not present

## 2015-12-26 DIAGNOSIS — E890 Postprocedural hypothyroidism: Secondary | ICD-10-CM | POA: Diagnosis not present

## 2015-12-27 IMAGING — CR DG HIP (WITH OR WITHOUT PELVIS) 2-3V*L*
4 series · 4 of 4 positions shown · non-contrast
Comparison: None.

CLINICAL DATA: Left hip pain.  No history of recent trauma

EXAM:
LEFT HIP (WITH PELVIS) 2-3 VIEWS

[t pelvis ap (1 of 2)]
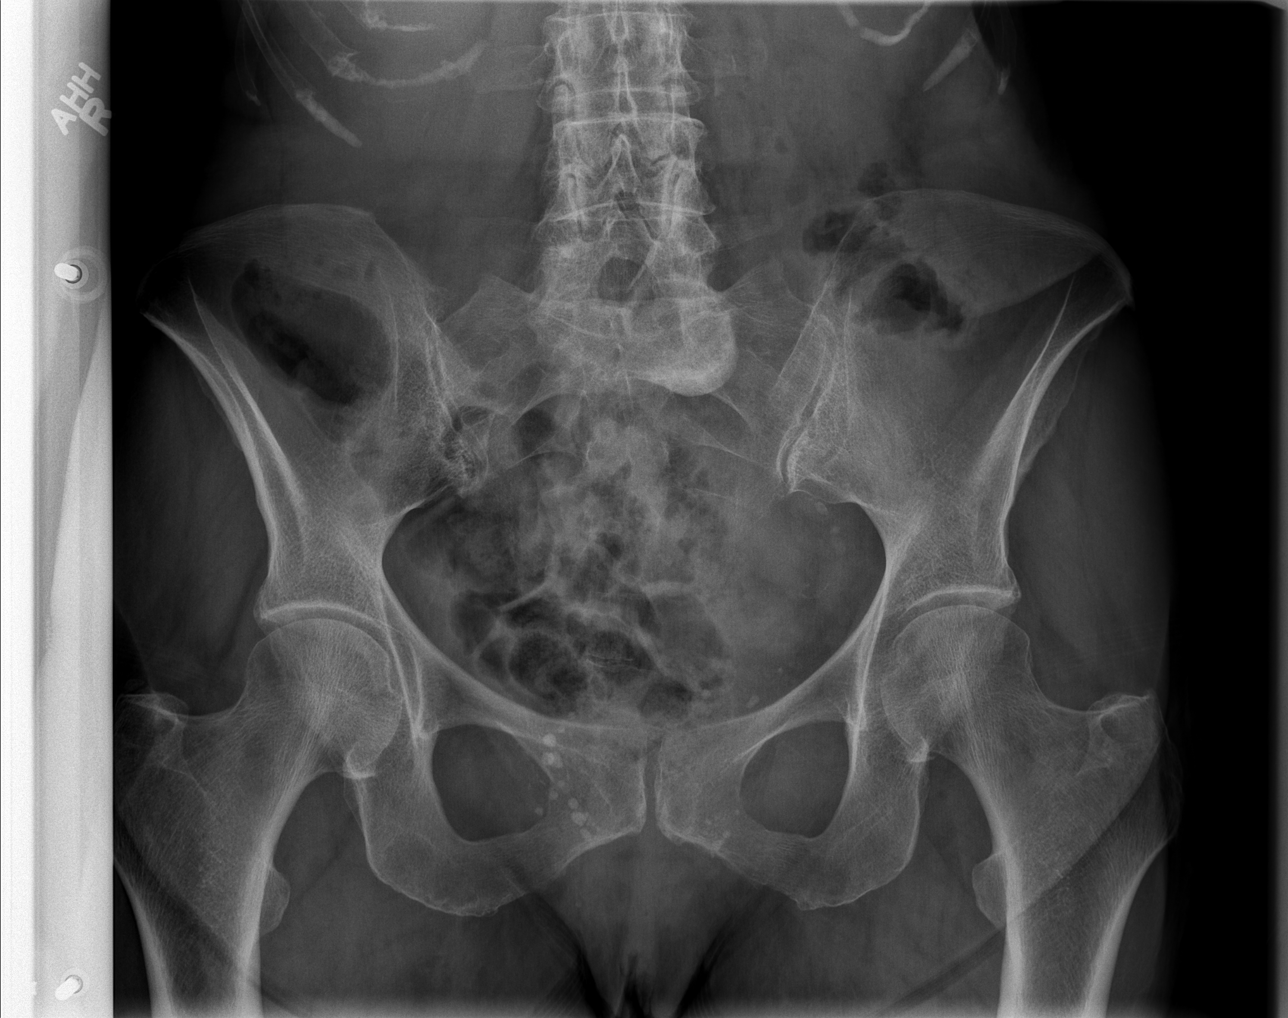

[t pelvis ap (2 of 2)]
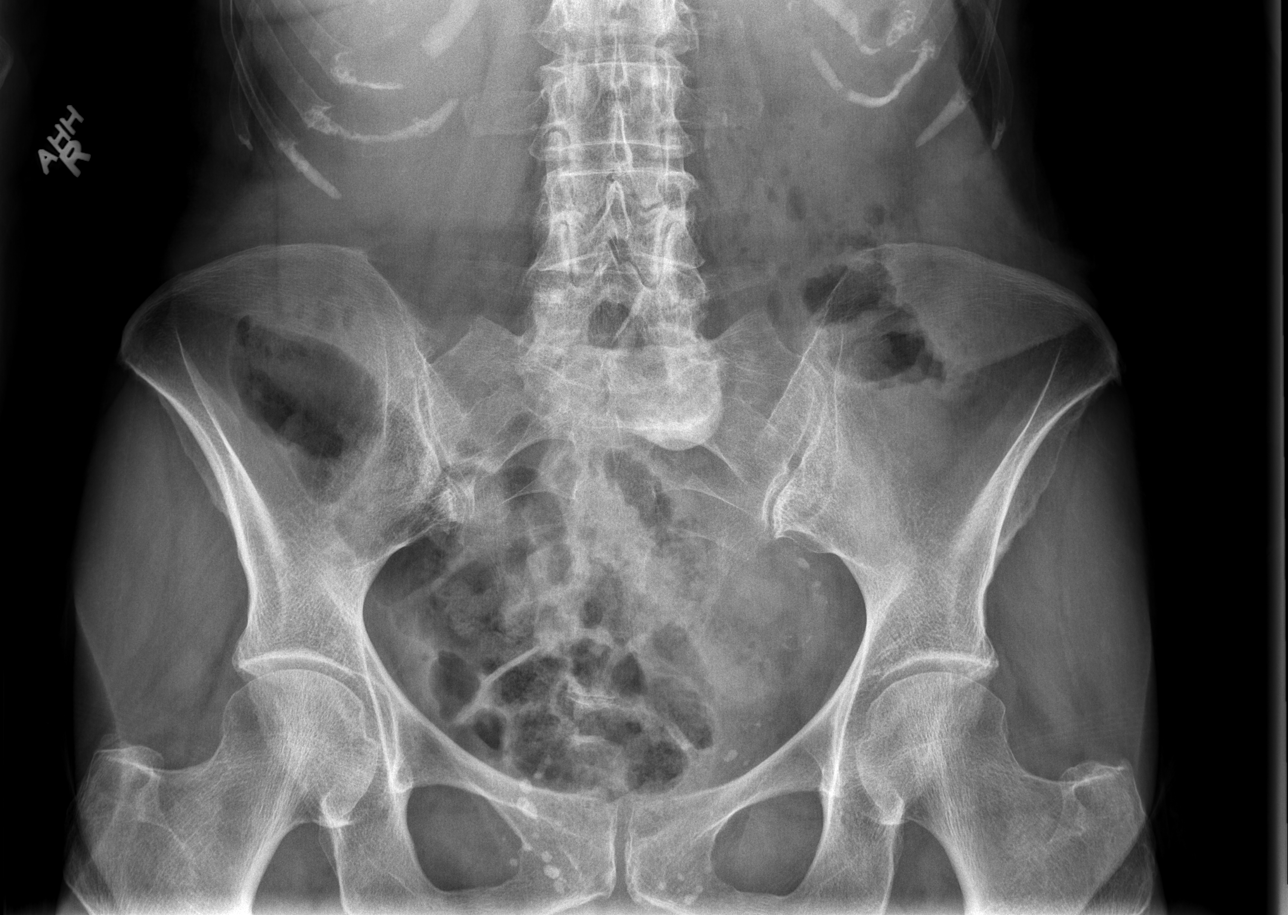

[t hip ap left]
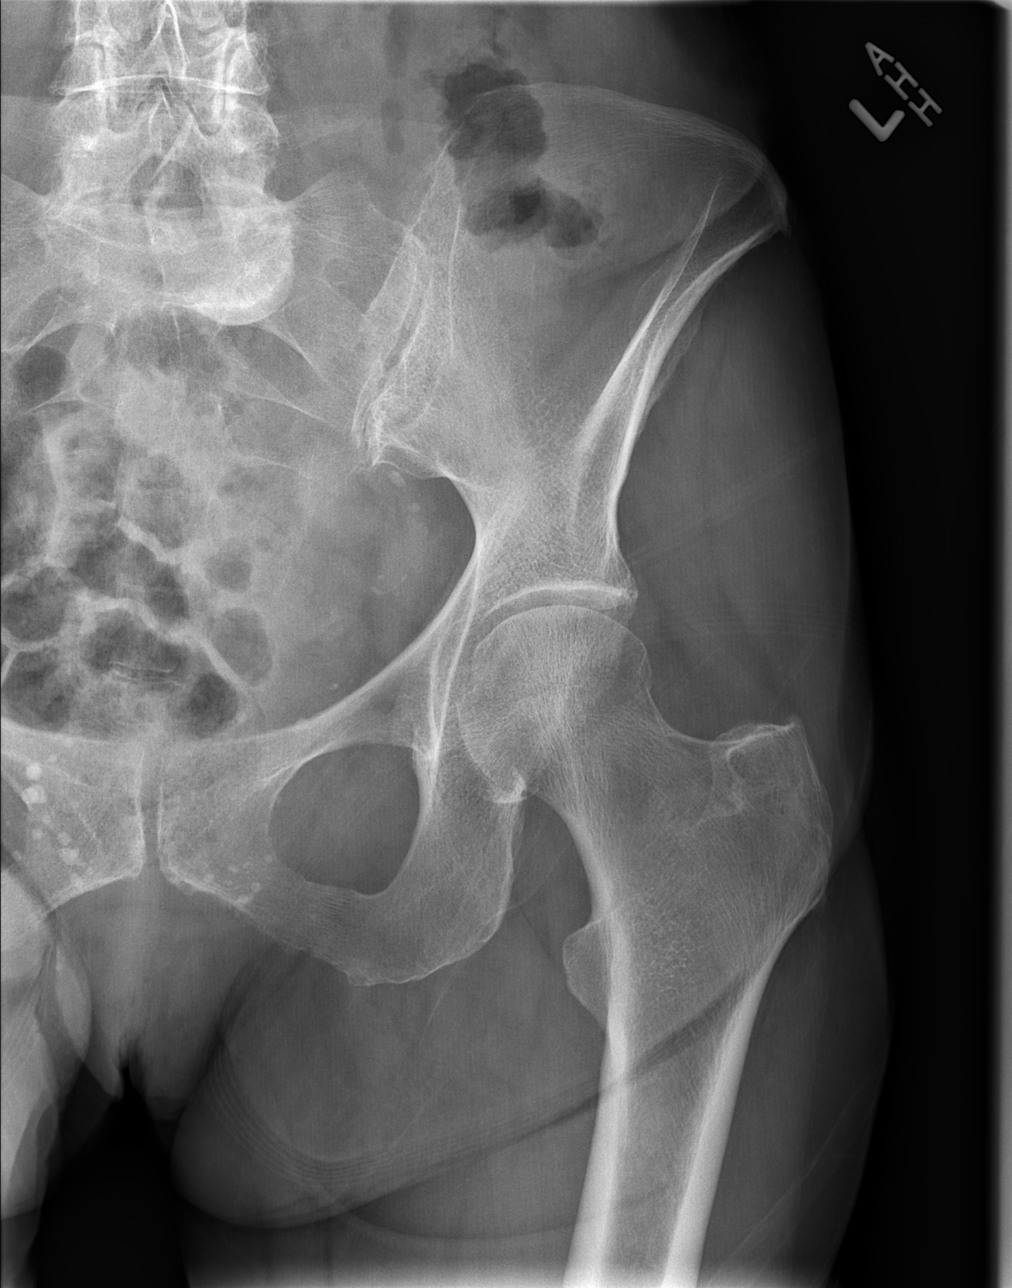

[t hip frog leg left]
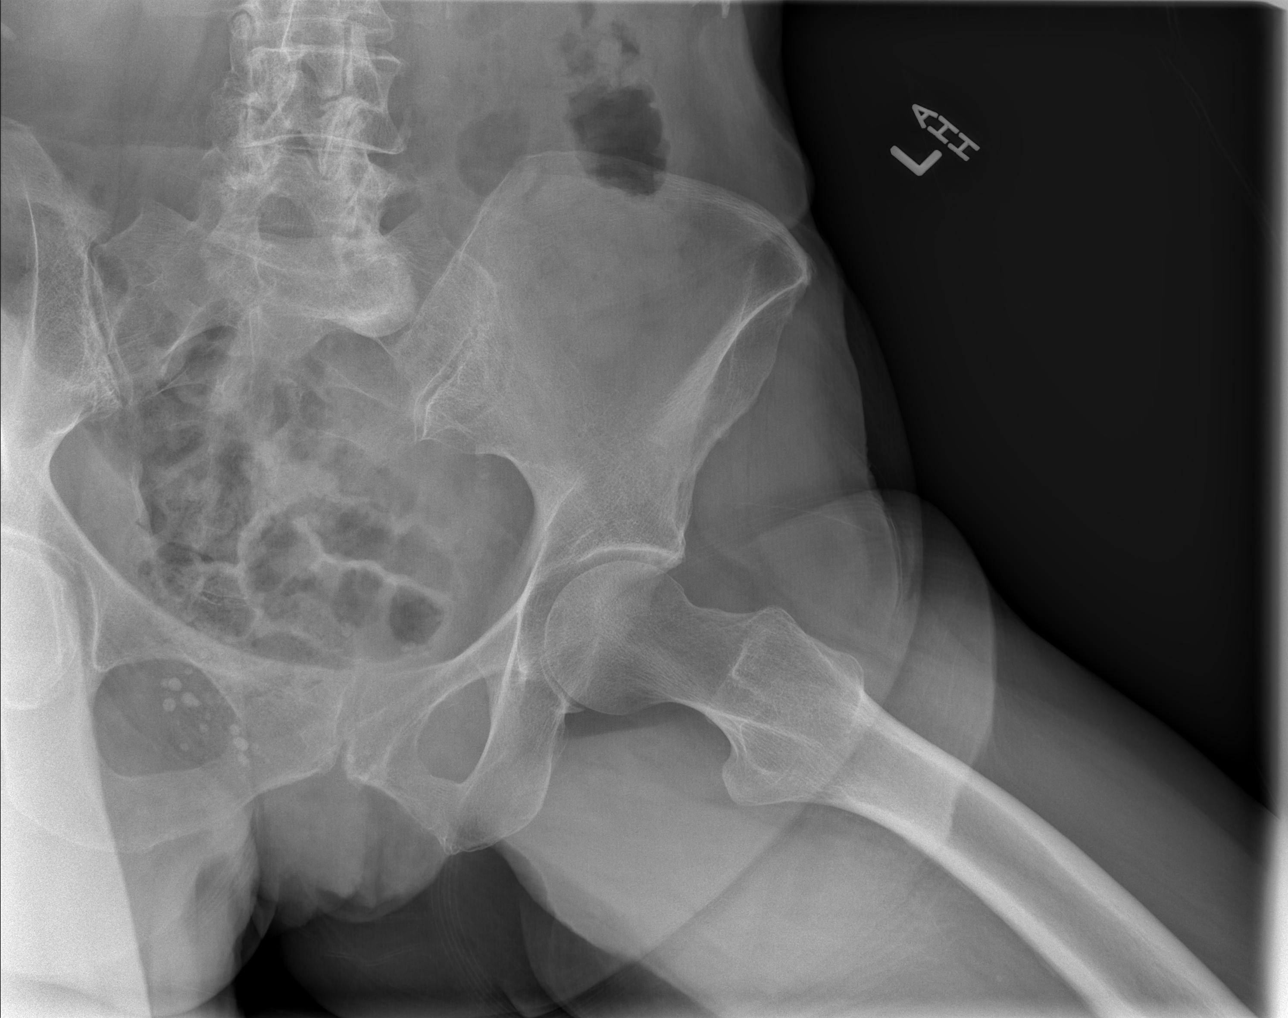

[4 of 4 positions shown; findings below may reference images not displayed]

FINDINGS: Frontal pelvis as well as frontal and lateral left hip images
obtained. There is no fracture or dislocation. Hip joints appear
symmetric and normal bilaterally. There is bony overgrowth overlying
the left upper sacrum, felt to be of arthropathic etiology. There
are phleboliths in the pelvis.
IMPRESSION: No fracture or dislocation. Hip joints appear symmetric and within
normal limits bilaterally.

## 2016-01-02 DIAGNOSIS — E890 Postprocedural hypothyroidism: Secondary | ICD-10-CM | POA: Diagnosis not present

## 2016-01-09 DIAGNOSIS — Z23 Encounter for immunization: Secondary | ICD-10-CM | POA: Diagnosis not present

## 2016-01-19 DIAGNOSIS — N390 Urinary tract infection, site not specified: Secondary | ICD-10-CM | POA: Diagnosis not present

## 2016-01-19 DIAGNOSIS — E559 Vitamin D deficiency, unspecified: Secondary | ICD-10-CM | POA: Diagnosis not present

## 2016-01-19 DIAGNOSIS — I10 Essential (primary) hypertension: Secondary | ICD-10-CM | POA: Diagnosis not present

## 2016-01-19 DIAGNOSIS — M810 Age-related osteoporosis without current pathological fracture: Secondary | ICD-10-CM | POA: Diagnosis not present

## 2016-01-26 DIAGNOSIS — I10 Essential (primary) hypertension: Secondary | ICD-10-CM | POA: Diagnosis not present

## 2016-01-26 DIAGNOSIS — M810 Age-related osteoporosis without current pathological fracture: Secondary | ICD-10-CM | POA: Diagnosis not present

## 2016-01-26 DIAGNOSIS — E0590 Thyrotoxicosis, unspecified without thyrotoxic crisis or storm: Secondary | ICD-10-CM | POA: Diagnosis not present

## 2016-01-26 DIAGNOSIS — E039 Hypothyroidism, unspecified: Secondary | ICD-10-CM | POA: Diagnosis not present

## 2016-02-13 DIAGNOSIS — H25813 Combined forms of age-related cataract, bilateral: Secondary | ICD-10-CM | POA: Diagnosis not present

## 2016-03-12 DIAGNOSIS — E890 Postprocedural hypothyroidism: Secondary | ICD-10-CM | POA: Diagnosis not present

## 2016-04-17 DIAGNOSIS — Z01419 Encounter for gynecological examination (general) (routine) without abnormal findings: Secondary | ICD-10-CM | POA: Diagnosis not present

## 2016-04-17 DIAGNOSIS — Z780 Asymptomatic menopausal state: Secondary | ICD-10-CM | POA: Diagnosis not present

## 2016-04-17 DIAGNOSIS — M810 Age-related osteoporosis without current pathological fracture: Secondary | ICD-10-CM | POA: Diagnosis not present

## 2016-04-17 DIAGNOSIS — Z1212 Encounter for screening for malignant neoplasm of rectum: Secondary | ICD-10-CM | POA: Diagnosis not present

## 2016-05-25 DIAGNOSIS — Z1231 Encounter for screening mammogram for malignant neoplasm of breast: Secondary | ICD-10-CM | POA: Diagnosis not present

## 2016-12-19 DIAGNOSIS — Z23 Encounter for immunization: Secondary | ICD-10-CM | POA: Diagnosis not present

## 2016-12-27 DIAGNOSIS — E890 Postprocedural hypothyroidism: Secondary | ICD-10-CM | POA: Diagnosis not present

## 2017-01-03 DIAGNOSIS — E890 Postprocedural hypothyroidism: Secondary | ICD-10-CM | POA: Diagnosis not present

## 2017-01-21 DIAGNOSIS — E039 Hypothyroidism, unspecified: Secondary | ICD-10-CM | POA: Diagnosis not present

## 2017-01-21 DIAGNOSIS — N390 Urinary tract infection, site not specified: Secondary | ICD-10-CM | POA: Diagnosis not present

## 2017-01-21 DIAGNOSIS — I10 Essential (primary) hypertension: Secondary | ICD-10-CM | POA: Diagnosis not present

## 2017-01-28 DIAGNOSIS — Z0000 Encounter for general adult medical examination without abnormal findings: Secondary | ICD-10-CM | POA: Diagnosis not present

## 2017-01-28 DIAGNOSIS — E039 Hypothyroidism, unspecified: Secondary | ICD-10-CM | POA: Diagnosis not present

## 2017-01-28 DIAGNOSIS — I10 Essential (primary) hypertension: Secondary | ICD-10-CM | POA: Diagnosis not present

## 2017-06-03 DIAGNOSIS — Z1231 Encounter for screening mammogram for malignant neoplasm of breast: Secondary | ICD-10-CM | POA: Diagnosis not present

## 2017-12-11 DIAGNOSIS — Z23 Encounter for immunization: Secondary | ICD-10-CM | POA: Diagnosis not present

## 2017-12-24 DIAGNOSIS — I10 Essential (primary) hypertension: Secondary | ICD-10-CM | POA: Diagnosis not present

## 2017-12-24 DIAGNOSIS — E890 Postprocedural hypothyroidism: Secondary | ICD-10-CM | POA: Diagnosis not present

## 2017-12-24 DIAGNOSIS — N390 Urinary tract infection, site not specified: Secondary | ICD-10-CM | POA: Diagnosis not present

## 2017-12-31 DIAGNOSIS — E890 Postprocedural hypothyroidism: Secondary | ICD-10-CM | POA: Diagnosis not present

## 2018-01-24 DIAGNOSIS — C44319 Basal cell carcinoma of skin of other parts of face: Secondary | ICD-10-CM | POA: Diagnosis not present

## 2018-01-24 DIAGNOSIS — C44722 Squamous cell carcinoma of skin of right lower limb, including hip: Secondary | ICD-10-CM | POA: Diagnosis not present

## 2018-01-24 DIAGNOSIS — L570 Actinic keratosis: Secondary | ICD-10-CM | POA: Diagnosis not present

## 2018-01-24 DIAGNOSIS — C44729 Squamous cell carcinoma of skin of left lower limb, including hip: Secondary | ICD-10-CM | POA: Diagnosis not present

## 2018-01-24 DIAGNOSIS — L905 Scar conditions and fibrosis of skin: Secondary | ICD-10-CM | POA: Diagnosis not present

## 2018-01-24 DIAGNOSIS — Z85828 Personal history of other malignant neoplasm of skin: Secondary | ICD-10-CM | POA: Diagnosis not present

## 2018-01-24 DIAGNOSIS — L821 Other seborrheic keratosis: Secondary | ICD-10-CM | POA: Diagnosis not present

## 2018-01-24 DIAGNOSIS — C44622 Squamous cell carcinoma of skin of right upper limb, including shoulder: Secondary | ICD-10-CM | POA: Diagnosis not present

## 2018-01-30 DIAGNOSIS — Z0000 Encounter for general adult medical examination without abnormal findings: Secondary | ICD-10-CM | POA: Diagnosis not present

## 2018-01-30 DIAGNOSIS — E890 Postprocedural hypothyroidism: Secondary | ICD-10-CM | POA: Diagnosis not present

## 2018-01-30 DIAGNOSIS — C4491 Basal cell carcinoma of skin, unspecified: Secondary | ICD-10-CM | POA: Diagnosis not present

## 2018-01-30 DIAGNOSIS — M810 Age-related osteoporosis without current pathological fracture: Secondary | ICD-10-CM | POA: Diagnosis not present

## 2018-01-30 DIAGNOSIS — I10 Essential (primary) hypertension: Secondary | ICD-10-CM | POA: Diagnosis not present

## 2018-01-30 DIAGNOSIS — C801 Malignant (primary) neoplasm, unspecified: Secondary | ICD-10-CM | POA: Diagnosis not present

## 2018-02-13 DIAGNOSIS — H04123 Dry eye syndrome of bilateral lacrimal glands: Secondary | ICD-10-CM | POA: Diagnosis not present

## 2018-02-13 DIAGNOSIS — H35033 Hypertensive retinopathy, bilateral: Secondary | ICD-10-CM | POA: Diagnosis not present

## 2018-02-13 DIAGNOSIS — H01025 Squamous blepharitis left lower eyelid: Secondary | ICD-10-CM | POA: Diagnosis not present

## 2018-02-13 DIAGNOSIS — H01021 Squamous blepharitis right upper eyelid: Secondary | ICD-10-CM | POA: Diagnosis not present

## 2018-02-13 DIAGNOSIS — H11153 Pinguecula, bilateral: Secondary | ICD-10-CM | POA: Diagnosis not present

## 2018-02-13 DIAGNOSIS — H01024 Squamous blepharitis left upper eyelid: Secondary | ICD-10-CM | POA: Diagnosis not present

## 2018-02-13 DIAGNOSIS — H01022 Squamous blepharitis right lower eyelid: Secondary | ICD-10-CM | POA: Diagnosis not present

## 2018-02-13 DIAGNOSIS — H25813 Combined forms of age-related cataract, bilateral: Secondary | ICD-10-CM | POA: Diagnosis not present

## 2018-03-18 DIAGNOSIS — Z85828 Personal history of other malignant neoplasm of skin: Secondary | ICD-10-CM | POA: Diagnosis not present

## 2018-03-18 DIAGNOSIS — C441122 Basal cell carcinoma of skin of right lower eyelid, including canthus: Secondary | ICD-10-CM | POA: Diagnosis not present

## 2018-06-17 DIAGNOSIS — Z1231 Encounter for screening mammogram for malignant neoplasm of breast: Secondary | ICD-10-CM | POA: Diagnosis not present

## 2018-07-31 DIAGNOSIS — D0461 Carcinoma in situ of skin of right upper limb, including shoulder: Secondary | ICD-10-CM | POA: Diagnosis not present

## 2018-07-31 DIAGNOSIS — L821 Other seborrheic keratosis: Secondary | ICD-10-CM | POA: Diagnosis not present

## 2018-07-31 DIAGNOSIS — Z85828 Personal history of other malignant neoplasm of skin: Secondary | ICD-10-CM | POA: Diagnosis not present

## 2018-07-31 DIAGNOSIS — L570 Actinic keratosis: Secondary | ICD-10-CM | POA: Diagnosis not present

## 2018-07-31 DIAGNOSIS — L814 Other melanin hyperpigmentation: Secondary | ICD-10-CM | POA: Diagnosis not present

## 2018-07-31 DIAGNOSIS — C44722 Squamous cell carcinoma of skin of right lower limb, including hip: Secondary | ICD-10-CM | POA: Diagnosis not present

## 2018-11-10 ENCOUNTER — Other Ambulatory Visit: Payer: Self-pay

## 2019-01-12 DIAGNOSIS — E890 Postprocedural hypothyroidism: Secondary | ICD-10-CM | POA: Diagnosis not present

## 2019-01-16 DIAGNOSIS — M810 Age-related osteoporosis without current pathological fracture: Secondary | ICD-10-CM | POA: Diagnosis not present

## 2019-01-16 DIAGNOSIS — E890 Postprocedural hypothyroidism: Secondary | ICD-10-CM | POA: Diagnosis not present

## 2019-01-16 DIAGNOSIS — I10 Essential (primary) hypertension: Secondary | ICD-10-CM | POA: Diagnosis not present

## 2019-01-26 DIAGNOSIS — I10 Essential (primary) hypertension: Secondary | ICD-10-CM | POA: Diagnosis not present

## 2019-01-26 DIAGNOSIS — Z0000 Encounter for general adult medical examination without abnormal findings: Secondary | ICD-10-CM | POA: Diagnosis not present

## 2019-01-26 DIAGNOSIS — E559 Vitamin D deficiency, unspecified: Secondary | ICD-10-CM | POA: Diagnosis not present

## 2019-02-02 DIAGNOSIS — C801 Malignant (primary) neoplasm, unspecified: Secondary | ICD-10-CM | POA: Diagnosis not present

## 2019-02-02 DIAGNOSIS — Z0000 Encounter for general adult medical examination without abnormal findings: Secondary | ICD-10-CM | POA: Diagnosis not present

## 2019-02-02 DIAGNOSIS — E559 Vitamin D deficiency, unspecified: Secondary | ICD-10-CM | POA: Diagnosis not present

## 2019-02-02 DIAGNOSIS — I10 Essential (primary) hypertension: Secondary | ICD-10-CM | POA: Diagnosis not present

## 2019-02-02 DIAGNOSIS — Z23 Encounter for immunization: Secondary | ICD-10-CM | POA: Diagnosis not present

## 2019-02-02 DIAGNOSIS — C4491 Basal cell carcinoma of skin, unspecified: Secondary | ICD-10-CM | POA: Diagnosis not present

## 2019-02-02 DIAGNOSIS — M810 Age-related osteoporosis without current pathological fracture: Secondary | ICD-10-CM | POA: Diagnosis not present

## 2019-02-02 DIAGNOSIS — Z7189 Other specified counseling: Secondary | ICD-10-CM | POA: Diagnosis not present

## 2019-02-02 DIAGNOSIS — E890 Postprocedural hypothyroidism: Secondary | ICD-10-CM | POA: Diagnosis not present

## 2019-02-04 DIAGNOSIS — C44622 Squamous cell carcinoma of skin of right upper limb, including shoulder: Secondary | ICD-10-CM | POA: Diagnosis not present

## 2019-02-04 DIAGNOSIS — L814 Other melanin hyperpigmentation: Secondary | ICD-10-CM | POA: Diagnosis not present

## 2019-02-04 DIAGNOSIS — D225 Melanocytic nevi of trunk: Secondary | ICD-10-CM | POA: Diagnosis not present

## 2019-02-04 DIAGNOSIS — L821 Other seborrheic keratosis: Secondary | ICD-10-CM | POA: Diagnosis not present

## 2019-02-04 DIAGNOSIS — Z85828 Personal history of other malignant neoplasm of skin: Secondary | ICD-10-CM | POA: Diagnosis not present

## 2019-04-29 ENCOUNTER — Ambulatory Visit: Payer: Medicare Other | Attending: Internal Medicine

## 2019-04-29 DIAGNOSIS — Z23 Encounter for immunization: Secondary | ICD-10-CM | POA: Diagnosis not present

## 2019-04-29 NOTE — Progress Notes (Signed)
   Covid-19 Vaccination Clinic  Name:  Sandra Brock    MRN: [id number] DOB: 04/09/1941  04/29/2019  [name] was observed post Covid-19 immunization for 15 minutes without incidence. She was provided with Vaccine Information Sheet and instruction to access the V-Safe system.   [name] was instructed to call 911 with any severe reactions post vaccine: Marland Kitchen Difficulty breathing  . Swelling of your face and throat  . A fast heartbeat  . A bad rash all over your body  . Dizziness and weakness    Immunizations Administered    Name Date Dose VIS Date Route   Pfizer COVID-19 Vaccine 04/29/2019 10:16 AM 0.3 mL 03/20/2019 Intramuscular   Manufacturer: [place]   Lot: [id number]   [place]: [id number]

## 2019-05-18 ENCOUNTER — Ambulatory Visit: Payer: Medicare Other | Attending: Internal Medicine

## 2019-05-18 DIAGNOSIS — Z23 Encounter for immunization: Secondary | ICD-10-CM | POA: Insufficient documentation

## 2019-05-18 NOTE — Progress Notes (Signed)
   Covid-19 Vaccination Clinic  Name:  [name]    MRN: [id number] DOB: [birth year]  05/18/2019  [name] was observed post Covid-19 immunization for 15 minutes without incidence. She was provided with Vaccine Information Sheet and instruction to access the V-Safe system.   [name] was instructed to call 911 with any severe reactions post vaccine: Marland Kitchen Difficulty breathing  . Swelling of your face and throat  . A fast heartbeat  . A bad rash all over your body  . Dizziness and weakness    Immunizations Administered    Name Date Dose VIS Date Route   Pfizer COVID-19 Vaccine 05/18/2019  3:21 PM 0.3 mL 03/20/2019 Intramuscular   Manufacturer: [place]   Lot: [id number]   [place]: [id number]

## 2019-06-09 DIAGNOSIS — M9904 Segmental and somatic dysfunction of sacral region: Secondary | ICD-10-CM | POA: Diagnosis not present

## 2019-06-09 DIAGNOSIS — M9903 Segmental and somatic dysfunction of lumbar region: Secondary | ICD-10-CM | POA: Diagnosis not present

## 2019-06-09 DIAGNOSIS — M9905 Segmental and somatic dysfunction of pelvic region: Secondary | ICD-10-CM | POA: Diagnosis not present

## 2019-06-09 DIAGNOSIS — M5136 Other intervertebral disc degeneration, lumbar region: Secondary | ICD-10-CM | POA: Diagnosis not present

## 2019-06-11 DIAGNOSIS — M9905 Segmental and somatic dysfunction of pelvic region: Secondary | ICD-10-CM | POA: Diagnosis not present

## 2019-06-11 DIAGNOSIS — M5136 Other intervertebral disc degeneration, lumbar region: Secondary | ICD-10-CM | POA: Diagnosis not present

## 2019-06-11 DIAGNOSIS — M9903 Segmental and somatic dysfunction of lumbar region: Secondary | ICD-10-CM | POA: Diagnosis not present

## 2019-06-11 DIAGNOSIS — M9904 Segmental and somatic dysfunction of sacral region: Secondary | ICD-10-CM | POA: Diagnosis not present

## 2019-06-15 DIAGNOSIS — M9903 Segmental and somatic dysfunction of lumbar region: Secondary | ICD-10-CM | POA: Diagnosis not present

## 2019-06-15 DIAGNOSIS — M9905 Segmental and somatic dysfunction of pelvic region: Secondary | ICD-10-CM | POA: Diagnosis not present

## 2019-06-15 DIAGNOSIS — M9904 Segmental and somatic dysfunction of sacral region: Secondary | ICD-10-CM | POA: Diagnosis not present

## 2019-06-15 DIAGNOSIS — M5136 Other intervertebral disc degeneration, lumbar region: Secondary | ICD-10-CM | POA: Diagnosis not present

## 2019-06-16 DIAGNOSIS — M5136 Other intervertebral disc degeneration, lumbar region: Secondary | ICD-10-CM | POA: Diagnosis not present

## 2019-06-16 DIAGNOSIS — M9905 Segmental and somatic dysfunction of pelvic region: Secondary | ICD-10-CM | POA: Diagnosis not present

## 2019-06-16 DIAGNOSIS — M9904 Segmental and somatic dysfunction of sacral region: Secondary | ICD-10-CM | POA: Diagnosis not present

## 2019-06-16 DIAGNOSIS — M9903 Segmental and somatic dysfunction of lumbar region: Secondary | ICD-10-CM | POA: Diagnosis not present

## 2019-06-18 DIAGNOSIS — M9903 Segmental and somatic dysfunction of lumbar region: Secondary | ICD-10-CM | POA: Diagnosis not present

## 2019-06-18 DIAGNOSIS — M5136 Other intervertebral disc degeneration, lumbar region: Secondary | ICD-10-CM | POA: Diagnosis not present

## 2019-06-18 DIAGNOSIS — M9905 Segmental and somatic dysfunction of pelvic region: Secondary | ICD-10-CM | POA: Diagnosis not present

## 2019-06-18 DIAGNOSIS — M9904 Segmental and somatic dysfunction of sacral region: Secondary | ICD-10-CM | POA: Diagnosis not present

## 2019-06-22 DIAGNOSIS — M5136 Other intervertebral disc degeneration, lumbar region: Secondary | ICD-10-CM | POA: Diagnosis not present

## 2019-06-22 DIAGNOSIS — M9904 Segmental and somatic dysfunction of sacral region: Secondary | ICD-10-CM | POA: Diagnosis not present

## 2019-06-22 DIAGNOSIS — M9903 Segmental and somatic dysfunction of lumbar region: Secondary | ICD-10-CM | POA: Diagnosis not present

## 2019-06-22 DIAGNOSIS — M9905 Segmental and somatic dysfunction of pelvic region: Secondary | ICD-10-CM | POA: Diagnosis not present

## 2019-06-23 DIAGNOSIS — M5136 Other intervertebral disc degeneration, lumbar region: Secondary | ICD-10-CM | POA: Diagnosis not present

## 2019-06-23 DIAGNOSIS — Z1231 Encounter for screening mammogram for malignant neoplasm of breast: Secondary | ICD-10-CM | POA: Diagnosis not present

## 2019-06-23 DIAGNOSIS — M9904 Segmental and somatic dysfunction of sacral region: Secondary | ICD-10-CM | POA: Diagnosis not present

## 2019-06-23 DIAGNOSIS — M9903 Segmental and somatic dysfunction of lumbar region: Secondary | ICD-10-CM | POA: Diagnosis not present

## 2019-06-23 DIAGNOSIS — M9905 Segmental and somatic dysfunction of pelvic region: Secondary | ICD-10-CM | POA: Diagnosis not present

## 2019-06-25 DIAGNOSIS — M5136 Other intervertebral disc degeneration, lumbar region: Secondary | ICD-10-CM | POA: Diagnosis not present

## 2019-06-25 DIAGNOSIS — M9904 Segmental and somatic dysfunction of sacral region: Secondary | ICD-10-CM | POA: Diagnosis not present

## 2019-06-25 DIAGNOSIS — M9903 Segmental and somatic dysfunction of lumbar region: Secondary | ICD-10-CM | POA: Diagnosis not present

## 2019-06-25 DIAGNOSIS — M9905 Segmental and somatic dysfunction of pelvic region: Secondary | ICD-10-CM | POA: Diagnosis not present

## 2019-06-29 DIAGNOSIS — M5136 Other intervertebral disc degeneration, lumbar region: Secondary | ICD-10-CM | POA: Diagnosis not present

## 2019-06-29 DIAGNOSIS — M9905 Segmental and somatic dysfunction of pelvic region: Secondary | ICD-10-CM | POA: Diagnosis not present

## 2019-06-29 DIAGNOSIS — M9903 Segmental and somatic dysfunction of lumbar region: Secondary | ICD-10-CM | POA: Diagnosis not present

## 2019-06-29 DIAGNOSIS — M9904 Segmental and somatic dysfunction of sacral region: Secondary | ICD-10-CM | POA: Diagnosis not present

## 2019-06-30 DIAGNOSIS — M5136 Other intervertebral disc degeneration, lumbar region: Secondary | ICD-10-CM | POA: Diagnosis not present

## 2019-06-30 DIAGNOSIS — M9905 Segmental and somatic dysfunction of pelvic region: Secondary | ICD-10-CM | POA: Diagnosis not present

## 2019-06-30 DIAGNOSIS — M9904 Segmental and somatic dysfunction of sacral region: Secondary | ICD-10-CM | POA: Diagnosis not present

## 2019-06-30 DIAGNOSIS — M9903 Segmental and somatic dysfunction of lumbar region: Secondary | ICD-10-CM | POA: Diagnosis not present

## 2019-07-02 DIAGNOSIS — M5136 Other intervertebral disc degeneration, lumbar region: Secondary | ICD-10-CM | POA: Diagnosis not present

## 2019-07-02 DIAGNOSIS — M9905 Segmental and somatic dysfunction of pelvic region: Secondary | ICD-10-CM | POA: Diagnosis not present

## 2019-07-02 DIAGNOSIS — M9903 Segmental and somatic dysfunction of lumbar region: Secondary | ICD-10-CM | POA: Diagnosis not present

## 2019-07-02 DIAGNOSIS — M9904 Segmental and somatic dysfunction of sacral region: Secondary | ICD-10-CM | POA: Diagnosis not present

## 2019-07-06 DIAGNOSIS — M9905 Segmental and somatic dysfunction of pelvic region: Secondary | ICD-10-CM | POA: Diagnosis not present

## 2019-07-06 DIAGNOSIS — M9903 Segmental and somatic dysfunction of lumbar region: Secondary | ICD-10-CM | POA: Diagnosis not present

## 2019-07-06 DIAGNOSIS — M5136 Other intervertebral disc degeneration, lumbar region: Secondary | ICD-10-CM | POA: Diagnosis not present

## 2019-07-06 DIAGNOSIS — M9904 Segmental and somatic dysfunction of sacral region: Secondary | ICD-10-CM | POA: Diagnosis not present

## 2019-07-08 DIAGNOSIS — M5136 Other intervertebral disc degeneration, lumbar region: Secondary | ICD-10-CM | POA: Diagnosis not present

## 2019-07-08 DIAGNOSIS — M9904 Segmental and somatic dysfunction of sacral region: Secondary | ICD-10-CM | POA: Diagnosis not present

## 2019-07-08 DIAGNOSIS — M9905 Segmental and somatic dysfunction of pelvic region: Secondary | ICD-10-CM | POA: Diagnosis not present

## 2019-07-08 DIAGNOSIS — M9903 Segmental and somatic dysfunction of lumbar region: Secondary | ICD-10-CM | POA: Diagnosis not present

## 2019-07-13 DIAGNOSIS — M5136 Other intervertebral disc degeneration, lumbar region: Secondary | ICD-10-CM | POA: Diagnosis not present

## 2019-07-13 DIAGNOSIS — M9905 Segmental and somatic dysfunction of pelvic region: Secondary | ICD-10-CM | POA: Diagnosis not present

## 2019-07-13 DIAGNOSIS — M9903 Segmental and somatic dysfunction of lumbar region: Secondary | ICD-10-CM | POA: Diagnosis not present

## 2019-07-13 DIAGNOSIS — M9904 Segmental and somatic dysfunction of sacral region: Secondary | ICD-10-CM | POA: Diagnosis not present

## 2019-07-15 DIAGNOSIS — M9905 Segmental and somatic dysfunction of pelvic region: Secondary | ICD-10-CM | POA: Diagnosis not present

## 2019-07-15 DIAGNOSIS — M5136 Other intervertebral disc degeneration, lumbar region: Secondary | ICD-10-CM | POA: Diagnosis not present

## 2019-07-15 DIAGNOSIS — M9904 Segmental and somatic dysfunction of sacral region: Secondary | ICD-10-CM | POA: Diagnosis not present

## 2019-07-15 DIAGNOSIS — M9903 Segmental and somatic dysfunction of lumbar region: Secondary | ICD-10-CM | POA: Diagnosis not present

## 2019-07-16 DIAGNOSIS — M9903 Segmental and somatic dysfunction of lumbar region: Secondary | ICD-10-CM | POA: Diagnosis not present

## 2019-07-16 DIAGNOSIS — M5136 Other intervertebral disc degeneration, lumbar region: Secondary | ICD-10-CM | POA: Diagnosis not present

## 2019-07-16 DIAGNOSIS — M9905 Segmental and somatic dysfunction of pelvic region: Secondary | ICD-10-CM | POA: Diagnosis not present

## 2019-07-16 DIAGNOSIS — M9904 Segmental and somatic dysfunction of sacral region: Secondary | ICD-10-CM | POA: Diagnosis not present

## 2019-07-20 DIAGNOSIS — M9904 Segmental and somatic dysfunction of sacral region: Secondary | ICD-10-CM | POA: Diagnosis not present

## 2019-07-20 DIAGNOSIS — M9905 Segmental and somatic dysfunction of pelvic region: Secondary | ICD-10-CM | POA: Diagnosis not present

## 2019-07-20 DIAGNOSIS — M5136 Other intervertebral disc degeneration, lumbar region: Secondary | ICD-10-CM | POA: Diagnosis not present

## 2019-07-20 DIAGNOSIS — M9903 Segmental and somatic dysfunction of lumbar region: Secondary | ICD-10-CM | POA: Diagnosis not present

## 2019-07-22 DIAGNOSIS — M9905 Segmental and somatic dysfunction of pelvic region: Secondary | ICD-10-CM | POA: Diagnosis not present

## 2019-07-22 DIAGNOSIS — M9903 Segmental and somatic dysfunction of lumbar region: Secondary | ICD-10-CM | POA: Diagnosis not present

## 2019-07-22 DIAGNOSIS — M9904 Segmental and somatic dysfunction of sacral region: Secondary | ICD-10-CM | POA: Diagnosis not present

## 2019-07-22 DIAGNOSIS — M5136 Other intervertebral disc degeneration, lumbar region: Secondary | ICD-10-CM | POA: Diagnosis not present

## 2019-07-23 DIAGNOSIS — M9903 Segmental and somatic dysfunction of lumbar region: Secondary | ICD-10-CM | POA: Diagnosis not present

## 2019-07-23 DIAGNOSIS — M5136 Other intervertebral disc degeneration, lumbar region: Secondary | ICD-10-CM | POA: Diagnosis not present

## 2019-07-23 DIAGNOSIS — M9905 Segmental and somatic dysfunction of pelvic region: Secondary | ICD-10-CM | POA: Diagnosis not present

## 2019-07-23 DIAGNOSIS — M9904 Segmental and somatic dysfunction of sacral region: Secondary | ICD-10-CM | POA: Diagnosis not present

## 2019-07-27 DIAGNOSIS — M9905 Segmental and somatic dysfunction of pelvic region: Secondary | ICD-10-CM | POA: Diagnosis not present

## 2019-07-27 DIAGNOSIS — M9904 Segmental and somatic dysfunction of sacral region: Secondary | ICD-10-CM | POA: Diagnosis not present

## 2019-07-27 DIAGNOSIS — M9903 Segmental and somatic dysfunction of lumbar region: Secondary | ICD-10-CM | POA: Diagnosis not present

## 2019-07-27 DIAGNOSIS — M5136 Other intervertebral disc degeneration, lumbar region: Secondary | ICD-10-CM | POA: Diagnosis not present

## 2019-07-29 DIAGNOSIS — M9905 Segmental and somatic dysfunction of pelvic region: Secondary | ICD-10-CM | POA: Diagnosis not present

## 2019-07-29 DIAGNOSIS — M5136 Other intervertebral disc degeneration, lumbar region: Secondary | ICD-10-CM | POA: Diagnosis not present

## 2019-07-29 DIAGNOSIS — M9903 Segmental and somatic dysfunction of lumbar region: Secondary | ICD-10-CM | POA: Diagnosis not present

## 2019-07-29 DIAGNOSIS — M9904 Segmental and somatic dysfunction of sacral region: Secondary | ICD-10-CM | POA: Diagnosis not present

## 2019-07-30 DIAGNOSIS — M9903 Segmental and somatic dysfunction of lumbar region: Secondary | ICD-10-CM | POA: Diagnosis not present

## 2019-07-30 DIAGNOSIS — M9905 Segmental and somatic dysfunction of pelvic region: Secondary | ICD-10-CM | POA: Diagnosis not present

## 2019-07-30 DIAGNOSIS — M9904 Segmental and somatic dysfunction of sacral region: Secondary | ICD-10-CM | POA: Diagnosis not present

## 2019-07-30 DIAGNOSIS — M5136 Other intervertebral disc degeneration, lumbar region: Secondary | ICD-10-CM | POA: Diagnosis not present

## 2019-08-03 DIAGNOSIS — M9903 Segmental and somatic dysfunction of lumbar region: Secondary | ICD-10-CM | POA: Diagnosis not present

## 2019-08-03 DIAGNOSIS — M5136 Other intervertebral disc degeneration, lumbar region: Secondary | ICD-10-CM | POA: Diagnosis not present

## 2019-08-03 DIAGNOSIS — M9905 Segmental and somatic dysfunction of pelvic region: Secondary | ICD-10-CM | POA: Diagnosis not present

## 2019-08-03 DIAGNOSIS — M9904 Segmental and somatic dysfunction of sacral region: Secondary | ICD-10-CM | POA: Diagnosis not present

## 2019-08-05 DIAGNOSIS — D1801 Hemangioma of skin and subcutaneous tissue: Secondary | ICD-10-CM | POA: Diagnosis not present

## 2019-08-05 DIAGNOSIS — L821 Other seborrheic keratosis: Secondary | ICD-10-CM | POA: Diagnosis not present

## 2019-08-05 DIAGNOSIS — Z85828 Personal history of other malignant neoplasm of skin: Secondary | ICD-10-CM | POA: Diagnosis not present

## 2019-08-05 DIAGNOSIS — M5136 Other intervertebral disc degeneration, lumbar region: Secondary | ICD-10-CM | POA: Diagnosis not present

## 2019-08-05 DIAGNOSIS — L570 Actinic keratosis: Secondary | ICD-10-CM | POA: Diagnosis not present

## 2019-08-05 DIAGNOSIS — M9905 Segmental and somatic dysfunction of pelvic region: Secondary | ICD-10-CM | POA: Diagnosis not present

## 2019-08-05 DIAGNOSIS — M9903 Segmental and somatic dysfunction of lumbar region: Secondary | ICD-10-CM | POA: Diagnosis not present

## 2019-08-05 DIAGNOSIS — M9904 Segmental and somatic dysfunction of sacral region: Secondary | ICD-10-CM | POA: Diagnosis not present

## 2019-08-10 DIAGNOSIS — M9903 Segmental and somatic dysfunction of lumbar region: Secondary | ICD-10-CM | POA: Diagnosis not present

## 2019-08-10 DIAGNOSIS — M9904 Segmental and somatic dysfunction of sacral region: Secondary | ICD-10-CM | POA: Diagnosis not present

## 2019-08-10 DIAGNOSIS — M9905 Segmental and somatic dysfunction of pelvic region: Secondary | ICD-10-CM | POA: Diagnosis not present

## 2019-08-10 DIAGNOSIS — M5136 Other intervertebral disc degeneration, lumbar region: Secondary | ICD-10-CM | POA: Diagnosis not present

## 2019-08-13 DIAGNOSIS — M9903 Segmental and somatic dysfunction of lumbar region: Secondary | ICD-10-CM | POA: Diagnosis not present

## 2019-08-13 DIAGNOSIS — M9904 Segmental and somatic dysfunction of sacral region: Secondary | ICD-10-CM | POA: Diagnosis not present

## 2019-08-13 DIAGNOSIS — M5136 Other intervertebral disc degeneration, lumbar region: Secondary | ICD-10-CM | POA: Diagnosis not present

## 2019-08-13 DIAGNOSIS — M9905 Segmental and somatic dysfunction of pelvic region: Secondary | ICD-10-CM | POA: Diagnosis not present

## 2019-08-20 DIAGNOSIS — M9905 Segmental and somatic dysfunction of pelvic region: Secondary | ICD-10-CM | POA: Diagnosis not present

## 2019-08-20 DIAGNOSIS — M9903 Segmental and somatic dysfunction of lumbar region: Secondary | ICD-10-CM | POA: Diagnosis not present

## 2019-08-20 DIAGNOSIS — M5136 Other intervertebral disc degeneration, lumbar region: Secondary | ICD-10-CM | POA: Diagnosis not present

## 2019-08-20 DIAGNOSIS — M9904 Segmental and somatic dysfunction of sacral region: Secondary | ICD-10-CM | POA: Diagnosis not present

## 2019-08-24 DIAGNOSIS — M5136 Other intervertebral disc degeneration, lumbar region: Secondary | ICD-10-CM | POA: Diagnosis not present

## 2019-08-24 DIAGNOSIS — M9903 Segmental and somatic dysfunction of lumbar region: Secondary | ICD-10-CM | POA: Diagnosis not present

## 2019-08-24 DIAGNOSIS — M9904 Segmental and somatic dysfunction of sacral region: Secondary | ICD-10-CM | POA: Diagnosis not present

## 2019-08-24 DIAGNOSIS — M9905 Segmental and somatic dysfunction of pelvic region: Secondary | ICD-10-CM | POA: Diagnosis not present

## 2019-08-31 DIAGNOSIS — M5136 Other intervertebral disc degeneration, lumbar region: Secondary | ICD-10-CM | POA: Diagnosis not present

## 2019-08-31 DIAGNOSIS — M9905 Segmental and somatic dysfunction of pelvic region: Secondary | ICD-10-CM | POA: Diagnosis not present

## 2019-08-31 DIAGNOSIS — M9903 Segmental and somatic dysfunction of lumbar region: Secondary | ICD-10-CM | POA: Diagnosis not present

## 2019-08-31 DIAGNOSIS — M9904 Segmental and somatic dysfunction of sacral region: Secondary | ICD-10-CM | POA: Diagnosis not present

## 2019-09-09 DIAGNOSIS — M5136 Other intervertebral disc degeneration, lumbar region: Secondary | ICD-10-CM | POA: Diagnosis not present

## 2019-09-09 DIAGNOSIS — M9903 Segmental and somatic dysfunction of lumbar region: Secondary | ICD-10-CM | POA: Diagnosis not present

## 2019-09-09 DIAGNOSIS — M9904 Segmental and somatic dysfunction of sacral region: Secondary | ICD-10-CM | POA: Diagnosis not present

## 2019-09-09 DIAGNOSIS — M9905 Segmental and somatic dysfunction of pelvic region: Secondary | ICD-10-CM | POA: Diagnosis not present

## 2019-09-15 DIAGNOSIS — M9903 Segmental and somatic dysfunction of lumbar region: Secondary | ICD-10-CM | POA: Diagnosis not present

## 2019-09-15 DIAGNOSIS — M9904 Segmental and somatic dysfunction of sacral region: Secondary | ICD-10-CM | POA: Diagnosis not present

## 2019-09-15 DIAGNOSIS — M5136 Other intervertebral disc degeneration, lumbar region: Secondary | ICD-10-CM | POA: Diagnosis not present

## 2019-09-15 DIAGNOSIS — M9905 Segmental and somatic dysfunction of pelvic region: Secondary | ICD-10-CM | POA: Diagnosis not present

## 2019-09-24 DIAGNOSIS — M9903 Segmental and somatic dysfunction of lumbar region: Secondary | ICD-10-CM | POA: Diagnosis not present

## 2019-09-24 DIAGNOSIS — M9905 Segmental and somatic dysfunction of pelvic region: Secondary | ICD-10-CM | POA: Diagnosis not present

## 2019-09-24 DIAGNOSIS — M5136 Other intervertebral disc degeneration, lumbar region: Secondary | ICD-10-CM | POA: Diagnosis not present

## 2019-09-24 DIAGNOSIS — M9904 Segmental and somatic dysfunction of sacral region: Secondary | ICD-10-CM | POA: Diagnosis not present

## 2019-10-06 DIAGNOSIS — M9905 Segmental and somatic dysfunction of pelvic region: Secondary | ICD-10-CM | POA: Diagnosis not present

## 2019-10-06 DIAGNOSIS — M9904 Segmental and somatic dysfunction of sacral region: Secondary | ICD-10-CM | POA: Diagnosis not present

## 2019-10-06 DIAGNOSIS — M5136 Other intervertebral disc degeneration, lumbar region: Secondary | ICD-10-CM | POA: Diagnosis not present

## 2019-10-06 DIAGNOSIS — M9903 Segmental and somatic dysfunction of lumbar region: Secondary | ICD-10-CM | POA: Diagnosis not present

## 2019-10-14 DIAGNOSIS — M5136 Other intervertebral disc degeneration, lumbar region: Secondary | ICD-10-CM | POA: Diagnosis not present

## 2019-10-14 DIAGNOSIS — M9905 Segmental and somatic dysfunction of pelvic region: Secondary | ICD-10-CM | POA: Diagnosis not present

## 2019-10-14 DIAGNOSIS — M9904 Segmental and somatic dysfunction of sacral region: Secondary | ICD-10-CM | POA: Diagnosis not present

## 2019-10-14 DIAGNOSIS — M9903 Segmental and somatic dysfunction of lumbar region: Secondary | ICD-10-CM | POA: Diagnosis not present

## 2019-10-29 DIAGNOSIS — M9905 Segmental and somatic dysfunction of pelvic region: Secondary | ICD-10-CM | POA: Diagnosis not present

## 2019-10-29 DIAGNOSIS — M9903 Segmental and somatic dysfunction of lumbar region: Secondary | ICD-10-CM | POA: Diagnosis not present

## 2019-10-29 DIAGNOSIS — M5136 Other intervertebral disc degeneration, lumbar region: Secondary | ICD-10-CM | POA: Diagnosis not present

## 2019-10-29 DIAGNOSIS — M9904 Segmental and somatic dysfunction of sacral region: Secondary | ICD-10-CM | POA: Diagnosis not present

## 2019-11-02 DIAGNOSIS — M545 Low back pain: Secondary | ICD-10-CM | POA: Diagnosis not present

## 2019-11-02 DIAGNOSIS — M5416 Radiculopathy, lumbar region: Secondary | ICD-10-CM | POA: Diagnosis not present

## 2019-11-17 DIAGNOSIS — M545 Low back pain: Secondary | ICD-10-CM | POA: Diagnosis not present

## 2019-11-19 DIAGNOSIS — M5416 Radiculopathy, lumbar region: Secondary | ICD-10-CM | POA: Diagnosis not present

## 2019-11-19 DIAGNOSIS — M545 Low back pain: Secondary | ICD-10-CM | POA: Diagnosis not present

## 2019-11-24 DIAGNOSIS — M5416 Radiculopathy, lumbar region: Secondary | ICD-10-CM | POA: Diagnosis not present

## 2019-12-08 DIAGNOSIS — M5416 Radiculopathy, lumbar region: Secondary | ICD-10-CM | POA: Diagnosis not present

## 2019-12-08 DIAGNOSIS — M545 Low back pain: Secondary | ICD-10-CM | POA: Diagnosis not present

## 2019-12-17 DIAGNOSIS — M545 Low back pain: Secondary | ICD-10-CM | POA: Diagnosis not present

## 2019-12-17 DIAGNOSIS — M5416 Radiculopathy, lumbar region: Secondary | ICD-10-CM | POA: Diagnosis not present

## 2019-12-22 DIAGNOSIS — M5416 Radiculopathy, lumbar region: Secondary | ICD-10-CM | POA: Diagnosis not present

## 2019-12-22 DIAGNOSIS — M545 Low back pain: Secondary | ICD-10-CM | POA: Diagnosis not present

## 2019-12-26 DIAGNOSIS — M5416 Radiculopathy, lumbar region: Secondary | ICD-10-CM | POA: Diagnosis not present

## 2019-12-30 DIAGNOSIS — M9904 Segmental and somatic dysfunction of sacral region: Secondary | ICD-10-CM | POA: Diagnosis not present

## 2019-12-30 DIAGNOSIS — M9903 Segmental and somatic dysfunction of lumbar region: Secondary | ICD-10-CM | POA: Diagnosis not present

## 2019-12-30 DIAGNOSIS — M5136 Other intervertebral disc degeneration, lumbar region: Secondary | ICD-10-CM | POA: Diagnosis not present

## 2019-12-30 DIAGNOSIS — M9905 Segmental and somatic dysfunction of pelvic region: Secondary | ICD-10-CM | POA: Diagnosis not present

## 2020-01-01 DIAGNOSIS — M5416 Radiculopathy, lumbar region: Secondary | ICD-10-CM | POA: Diagnosis not present

## 2020-01-01 DIAGNOSIS — M545 Low back pain: Secondary | ICD-10-CM | POA: Diagnosis not present

## 2020-01-06 DIAGNOSIS — M9904 Segmental and somatic dysfunction of sacral region: Secondary | ICD-10-CM | POA: Diagnosis not present

## 2020-01-06 DIAGNOSIS — M5136 Other intervertebral disc degeneration, lumbar region: Secondary | ICD-10-CM | POA: Diagnosis not present

## 2020-01-06 DIAGNOSIS — M9905 Segmental and somatic dysfunction of pelvic region: Secondary | ICD-10-CM | POA: Diagnosis not present

## 2020-01-06 DIAGNOSIS — M9903 Segmental and somatic dysfunction of lumbar region: Secondary | ICD-10-CM | POA: Diagnosis not present

## 2020-01-08 DIAGNOSIS — E7800 Pure hypercholesterolemia, unspecified: Secondary | ICD-10-CM | POA: Diagnosis not present

## 2020-01-08 DIAGNOSIS — E890 Postprocedural hypothyroidism: Secondary | ICD-10-CM | POA: Diagnosis not present

## 2020-01-08 DIAGNOSIS — E559 Vitamin D deficiency, unspecified: Secondary | ICD-10-CM | POA: Diagnosis not present

## 2020-01-08 DIAGNOSIS — Z0000 Encounter for general adult medical examination without abnormal findings: Secondary | ICD-10-CM | POA: Diagnosis not present

## 2020-01-08 DIAGNOSIS — M810 Age-related osteoporosis without current pathological fracture: Secondary | ICD-10-CM | POA: Diagnosis not present

## 2020-01-08 DIAGNOSIS — I10 Essential (primary) hypertension: Secondary | ICD-10-CM | POA: Diagnosis not present

## 2020-01-11 DIAGNOSIS — M5416 Radiculopathy, lumbar region: Secondary | ICD-10-CM | POA: Diagnosis not present

## 2020-01-15 DIAGNOSIS — M810 Age-related osteoporosis without current pathological fracture: Secondary | ICD-10-CM | POA: Diagnosis not present

## 2020-01-15 DIAGNOSIS — I10 Essential (primary) hypertension: Secondary | ICD-10-CM | POA: Diagnosis not present

## 2020-01-15 DIAGNOSIS — E890 Postprocedural hypothyroidism: Secondary | ICD-10-CM | POA: Diagnosis not present

## 2020-01-21 DIAGNOSIS — M5136 Other intervertebral disc degeneration, lumbar region: Secondary | ICD-10-CM | POA: Diagnosis not present

## 2020-01-21 DIAGNOSIS — M9903 Segmental and somatic dysfunction of lumbar region: Secondary | ICD-10-CM | POA: Diagnosis not present

## 2020-01-21 DIAGNOSIS — M9905 Segmental and somatic dysfunction of pelvic region: Secondary | ICD-10-CM | POA: Diagnosis not present

## 2020-01-21 DIAGNOSIS — M9904 Segmental and somatic dysfunction of sacral region: Secondary | ICD-10-CM | POA: Diagnosis not present

## 2020-01-27 DIAGNOSIS — Z0000 Encounter for general adult medical examination without abnormal findings: Secondary | ICD-10-CM | POA: Diagnosis not present

## 2020-01-27 DIAGNOSIS — E559 Vitamin D deficiency, unspecified: Secondary | ICD-10-CM | POA: Diagnosis not present

## 2020-01-27 DIAGNOSIS — R739 Hyperglycemia, unspecified: Secondary | ICD-10-CM | POA: Diagnosis not present

## 2020-01-27 DIAGNOSIS — E7800 Pure hypercholesterolemia, unspecified: Secondary | ICD-10-CM | POA: Diagnosis not present

## 2020-01-27 DIAGNOSIS — I10 Essential (primary) hypertension: Secondary | ICD-10-CM | POA: Diagnosis not present

## 2020-01-27 DIAGNOSIS — E890 Postprocedural hypothyroidism: Secondary | ICD-10-CM | POA: Diagnosis not present

## 2020-02-02 DIAGNOSIS — M9905 Segmental and somatic dysfunction of pelvic region: Secondary | ICD-10-CM | POA: Diagnosis not present

## 2020-02-02 DIAGNOSIS — M9904 Segmental and somatic dysfunction of sacral region: Secondary | ICD-10-CM | POA: Diagnosis not present

## 2020-02-02 DIAGNOSIS — M5136 Other intervertebral disc degeneration, lumbar region: Secondary | ICD-10-CM | POA: Diagnosis not present

## 2020-02-02 DIAGNOSIS — M9903 Segmental and somatic dysfunction of lumbar region: Secondary | ICD-10-CM | POA: Diagnosis not present

## 2020-02-03 DIAGNOSIS — I10 Essential (primary) hypertension: Secondary | ICD-10-CM | POA: Diagnosis not present

## 2020-02-03 DIAGNOSIS — Z0000 Encounter for general adult medical examination without abnormal findings: Secondary | ICD-10-CM | POA: Diagnosis not present

## 2020-02-03 DIAGNOSIS — Z85828 Personal history of other malignant neoplasm of skin: Secondary | ICD-10-CM | POA: Diagnosis not present

## 2020-02-03 DIAGNOSIS — M810 Age-related osteoporosis without current pathological fracture: Secondary | ICD-10-CM | POA: Diagnosis not present

## 2020-02-03 DIAGNOSIS — M5116 Intervertebral disc disorders with radiculopathy, lumbar region: Secondary | ICD-10-CM | POA: Diagnosis not present

## 2020-02-05 DIAGNOSIS — L570 Actinic keratosis: Secondary | ICD-10-CM | POA: Diagnosis not present

## 2020-02-05 DIAGNOSIS — D225 Melanocytic nevi of trunk: Secondary | ICD-10-CM | POA: Diagnosis not present

## 2020-02-05 DIAGNOSIS — Z85828 Personal history of other malignant neoplasm of skin: Secondary | ICD-10-CM | POA: Diagnosis not present

## 2020-02-05 DIAGNOSIS — C44729 Squamous cell carcinoma of skin of left lower limb, including hip: Secondary | ICD-10-CM | POA: Diagnosis not present

## 2020-02-05 DIAGNOSIS — D2262 Melanocytic nevi of left upper limb, including shoulder: Secondary | ICD-10-CM | POA: Diagnosis not present

## 2020-02-05 DIAGNOSIS — L814 Other melanin hyperpigmentation: Secondary | ICD-10-CM | POA: Diagnosis not present

## 2020-02-05 DIAGNOSIS — L821 Other seborrheic keratosis: Secondary | ICD-10-CM | POA: Diagnosis not present

## 2020-02-05 DIAGNOSIS — D692 Other nonthrombocytopenic purpura: Secondary | ICD-10-CM | POA: Diagnosis not present

## 2020-02-10 DIAGNOSIS — M9903 Segmental and somatic dysfunction of lumbar region: Secondary | ICD-10-CM | POA: Diagnosis not present

## 2020-02-10 DIAGNOSIS — M5136 Other intervertebral disc degeneration, lumbar region: Secondary | ICD-10-CM | POA: Diagnosis not present

## 2020-02-10 DIAGNOSIS — M9904 Segmental and somatic dysfunction of sacral region: Secondary | ICD-10-CM | POA: Diagnosis not present

## 2020-02-10 DIAGNOSIS — M9905 Segmental and somatic dysfunction of pelvic region: Secondary | ICD-10-CM | POA: Diagnosis not present

## 2020-02-18 DIAGNOSIS — M5416 Radiculopathy, lumbar region: Secondary | ICD-10-CM | POA: Diagnosis not present

## 2020-02-25 DIAGNOSIS — M9903 Segmental and somatic dysfunction of lumbar region: Secondary | ICD-10-CM | POA: Diagnosis not present

## 2020-02-25 DIAGNOSIS — M5136 Other intervertebral disc degeneration, lumbar region: Secondary | ICD-10-CM | POA: Diagnosis not present

## 2020-02-25 DIAGNOSIS — M9904 Segmental and somatic dysfunction of sacral region: Secondary | ICD-10-CM | POA: Diagnosis not present

## 2020-02-25 DIAGNOSIS — M9905 Segmental and somatic dysfunction of pelvic region: Secondary | ICD-10-CM | POA: Diagnosis not present

## 2020-03-09 DIAGNOSIS — M5136 Other intervertebral disc degeneration, lumbar region: Secondary | ICD-10-CM | POA: Diagnosis not present

## 2020-03-09 DIAGNOSIS — M9903 Segmental and somatic dysfunction of lumbar region: Secondary | ICD-10-CM | POA: Diagnosis not present

## 2020-03-09 DIAGNOSIS — M9905 Segmental and somatic dysfunction of pelvic region: Secondary | ICD-10-CM | POA: Diagnosis not present

## 2020-03-09 DIAGNOSIS — M9904 Segmental and somatic dysfunction of sacral region: Secondary | ICD-10-CM | POA: Diagnosis not present

## 2020-03-14 DIAGNOSIS — M8580 Other specified disorders of bone density and structure, unspecified site: Secondary | ICD-10-CM | POA: Diagnosis not present

## 2020-03-14 DIAGNOSIS — M5416 Radiculopathy, lumbar region: Secondary | ICD-10-CM | POA: Diagnosis not present

## 2020-03-21 DIAGNOSIS — Z23 Encounter for immunization: Secondary | ICD-10-CM | POA: Diagnosis not present

## 2020-03-23 DIAGNOSIS — M9905 Segmental and somatic dysfunction of pelvic region: Secondary | ICD-10-CM | POA: Diagnosis not present

## 2020-03-23 DIAGNOSIS — M5136 Other intervertebral disc degeneration, lumbar region: Secondary | ICD-10-CM | POA: Diagnosis not present

## 2020-03-23 DIAGNOSIS — M9903 Segmental and somatic dysfunction of lumbar region: Secondary | ICD-10-CM | POA: Diagnosis not present

## 2020-03-23 DIAGNOSIS — M9904 Segmental and somatic dysfunction of sacral region: Secondary | ICD-10-CM | POA: Diagnosis not present

## 2020-04-06 DIAGNOSIS — M9905 Segmental and somatic dysfunction of pelvic region: Secondary | ICD-10-CM | POA: Diagnosis not present

## 2020-04-06 DIAGNOSIS — M9904 Segmental and somatic dysfunction of sacral region: Secondary | ICD-10-CM | POA: Diagnosis not present

## 2020-04-06 DIAGNOSIS — M9903 Segmental and somatic dysfunction of lumbar region: Secondary | ICD-10-CM | POA: Diagnosis not present

## 2020-04-06 DIAGNOSIS — M5136 Other intervertebral disc degeneration, lumbar region: Secondary | ICD-10-CM | POA: Diagnosis not present

## 2020-04-18 DIAGNOSIS — M5416 Radiculopathy, lumbar region: Secondary | ICD-10-CM | POA: Diagnosis not present

## 2020-05-03 DIAGNOSIS — M5416 Radiculopathy, lumbar region: Secondary | ICD-10-CM | POA: Diagnosis not present

## 2020-05-17 DIAGNOSIS — M5416 Radiculopathy, lumbar region: Secondary | ICD-10-CM | POA: Diagnosis not present

## 2020-06-14 DIAGNOSIS — M5416 Radiculopathy, lumbar region: Secondary | ICD-10-CM | POA: Diagnosis not present

## 2020-06-14 DIAGNOSIS — M5459 Other low back pain: Secondary | ICD-10-CM | POA: Diagnosis not present

## 2020-06-28 DIAGNOSIS — Z1231 Encounter for screening mammogram for malignant neoplasm of breast: Secondary | ICD-10-CM | POA: Diagnosis not present

## 2020-07-14 DIAGNOSIS — M48062 Spinal stenosis, lumbar region with neurogenic claudication: Secondary | ICD-10-CM | POA: Diagnosis not present

## 2020-07-14 DIAGNOSIS — M5416 Radiculopathy, lumbar region: Secondary | ICD-10-CM | POA: Diagnosis not present

## 2020-08-04 DIAGNOSIS — M5416 Radiculopathy, lumbar region: Secondary | ICD-10-CM | POA: Diagnosis not present

## 2020-08-12 DIAGNOSIS — L821 Other seborrheic keratosis: Secondary | ICD-10-CM | POA: Diagnosis not present

## 2020-08-12 DIAGNOSIS — D1801 Hemangioma of skin and subcutaneous tissue: Secondary | ICD-10-CM | POA: Diagnosis not present

## 2020-08-12 DIAGNOSIS — Z85828 Personal history of other malignant neoplasm of skin: Secondary | ICD-10-CM | POA: Diagnosis not present

## 2020-08-12 DIAGNOSIS — D485 Neoplasm of uncertain behavior of skin: Secondary | ICD-10-CM | POA: Diagnosis not present

## 2020-08-12 DIAGNOSIS — L570 Actinic keratosis: Secondary | ICD-10-CM | POA: Diagnosis not present

## 2020-08-12 DIAGNOSIS — L859 Epidermal thickening, unspecified: Secondary | ICD-10-CM | POA: Diagnosis not present

## 2020-08-12 DIAGNOSIS — D2262 Melanocytic nevi of left upper limb, including shoulder: Secondary | ICD-10-CM | POA: Diagnosis not present

## 2020-08-12 DIAGNOSIS — D225 Melanocytic nevi of trunk: Secondary | ICD-10-CM | POA: Diagnosis not present

## 2020-08-18 DIAGNOSIS — M5416 Radiculopathy, lumbar region: Secondary | ICD-10-CM | POA: Diagnosis not present

## 2020-10-04 DIAGNOSIS — Z23 Encounter for immunization: Secondary | ICD-10-CM | POA: Diagnosis not present

## 2020-11-17 DIAGNOSIS — M5416 Radiculopathy, lumbar region: Secondary | ICD-10-CM | POA: Diagnosis not present

## 2020-11-17 DIAGNOSIS — M5459 Other low back pain: Secondary | ICD-10-CM | POA: Diagnosis not present

## 2020-12-22 DIAGNOSIS — M5416 Radiculopathy, lumbar region: Secondary | ICD-10-CM | POA: Diagnosis not present

## 2021-01-26 DIAGNOSIS — I10 Essential (primary) hypertension: Secondary | ICD-10-CM | POA: Diagnosis not present

## 2021-01-26 DIAGNOSIS — Z0000 Encounter for general adult medical examination without abnormal findings: Secondary | ICD-10-CM | POA: Diagnosis not present

## 2021-01-27 DIAGNOSIS — E890 Postprocedural hypothyroidism: Secondary | ICD-10-CM | POA: Diagnosis not present

## 2021-01-27 DIAGNOSIS — E7800 Pure hypercholesterolemia, unspecified: Secondary | ICD-10-CM | POA: Diagnosis not present

## 2021-01-27 DIAGNOSIS — E039 Hypothyroidism, unspecified: Secondary | ICD-10-CM | POA: Diagnosis not present

## 2021-01-27 DIAGNOSIS — E559 Vitamin D deficiency, unspecified: Secondary | ICD-10-CM | POA: Diagnosis not present

## 2021-01-27 DIAGNOSIS — Z0000 Encounter for general adult medical examination without abnormal findings: Secondary | ICD-10-CM | POA: Diagnosis not present

## 2021-01-27 DIAGNOSIS — R739 Hyperglycemia, unspecified: Secondary | ICD-10-CM | POA: Diagnosis not present

## 2021-01-27 DIAGNOSIS — I10 Essential (primary) hypertension: Secondary | ICD-10-CM | POA: Diagnosis not present

## 2021-02-02 DIAGNOSIS — E890 Postprocedural hypothyroidism: Secondary | ICD-10-CM | POA: Diagnosis not present

## 2021-02-02 DIAGNOSIS — E559 Vitamin D deficiency, unspecified: Secondary | ICD-10-CM | POA: Diagnosis not present

## 2021-02-02 DIAGNOSIS — Z23 Encounter for immunization: Secondary | ICD-10-CM | POA: Diagnosis not present

## 2021-02-02 DIAGNOSIS — M810 Age-related osteoporosis without current pathological fracture: Secondary | ICD-10-CM | POA: Diagnosis not present

## 2021-02-02 DIAGNOSIS — I10 Essential (primary) hypertension: Secondary | ICD-10-CM | POA: Diagnosis not present

## 2021-02-03 DIAGNOSIS — E890 Postprocedural hypothyroidism: Secondary | ICD-10-CM | POA: Diagnosis not present

## 2021-02-03 DIAGNOSIS — I10 Essential (primary) hypertension: Secondary | ICD-10-CM | POA: Diagnosis not present

## 2021-02-03 DIAGNOSIS — Z85828 Personal history of other malignant neoplasm of skin: Secondary | ICD-10-CM | POA: Diagnosis not present

## 2021-02-03 DIAGNOSIS — Z0000 Encounter for general adult medical examination without abnormal findings: Secondary | ICD-10-CM | POA: Diagnosis not present

## 2021-02-03 DIAGNOSIS — M810 Age-related osteoporosis without current pathological fracture: Secondary | ICD-10-CM | POA: Diagnosis not present

## 2021-02-03 DIAGNOSIS — M5116 Intervertebral disc disorders with radiculopathy, lumbar region: Secondary | ICD-10-CM | POA: Diagnosis not present

## 2021-02-03 DIAGNOSIS — E559 Vitamin D deficiency, unspecified: Secondary | ICD-10-CM | POA: Diagnosis not present

## 2021-02-03 DIAGNOSIS — R739 Hyperglycemia, unspecified: Secondary | ICD-10-CM | POA: Diagnosis not present

## 2021-02-03 DIAGNOSIS — E7800 Pure hypercholesterolemia, unspecified: Secondary | ICD-10-CM | POA: Diagnosis not present

## 2021-02-15 DIAGNOSIS — D225 Melanocytic nevi of trunk: Secondary | ICD-10-CM | POA: Diagnosis not present

## 2021-02-15 DIAGNOSIS — D485 Neoplasm of uncertain behavior of skin: Secondary | ICD-10-CM | POA: Diagnosis not present

## 2021-02-15 DIAGNOSIS — Z85828 Personal history of other malignant neoplasm of skin: Secondary | ICD-10-CM | POA: Diagnosis not present

## 2021-02-15 DIAGNOSIS — L814 Other melanin hyperpigmentation: Secondary | ICD-10-CM | POA: Diagnosis not present

## 2021-02-15 DIAGNOSIS — L570 Actinic keratosis: Secondary | ICD-10-CM | POA: Diagnosis not present

## 2021-02-15 DIAGNOSIS — L821 Other seborrheic keratosis: Secondary | ICD-10-CM | POA: Diagnosis not present

## 2021-02-22 DIAGNOSIS — M810 Age-related osteoporosis without current pathological fracture: Secondary | ICD-10-CM | POA: Diagnosis not present

## 2021-03-24 DIAGNOSIS — Z23 Encounter for immunization: Secondary | ICD-10-CM | POA: Diagnosis not present

## 2021-05-15 DIAGNOSIS — M5416 Radiculopathy, lumbar region: Secondary | ICD-10-CM | POA: Diagnosis not present

## 2021-05-30 DIAGNOSIS — M5416 Radiculopathy, lumbar region: Secondary | ICD-10-CM | POA: Diagnosis not present

## 2021-06-23 DIAGNOSIS — H35033 Hypertensive retinopathy, bilateral: Secondary | ICD-10-CM | POA: Diagnosis not present

## 2021-06-23 DIAGNOSIS — H25813 Combined forms of age-related cataract, bilateral: Secondary | ICD-10-CM | POA: Diagnosis not present

## 2021-06-23 DIAGNOSIS — H0102A Squamous blepharitis right eye, upper and lower eyelids: Secondary | ICD-10-CM | POA: Diagnosis not present

## 2021-06-23 DIAGNOSIS — H11153 Pinguecula, bilateral: Secondary | ICD-10-CM | POA: Diagnosis not present

## 2021-06-23 DIAGNOSIS — H04123 Dry eye syndrome of bilateral lacrimal glands: Secondary | ICD-10-CM | POA: Diagnosis not present

## 2021-06-23 DIAGNOSIS — H0102B Squamous blepharitis left eye, upper and lower eyelids: Secondary | ICD-10-CM | POA: Diagnosis not present

## 2021-07-03 DIAGNOSIS — Z1231 Encounter for screening mammogram for malignant neoplasm of breast: Secondary | ICD-10-CM | POA: Diagnosis not present

## 2021-08-15 DIAGNOSIS — D2262 Melanocytic nevi of left upper limb, including shoulder: Secondary | ICD-10-CM | POA: Diagnosis not present

## 2021-08-15 DIAGNOSIS — L814 Other melanin hyperpigmentation: Secondary | ICD-10-CM | POA: Diagnosis not present

## 2021-08-15 DIAGNOSIS — D2261 Melanocytic nevi of right upper limb, including shoulder: Secondary | ICD-10-CM | POA: Diagnosis not present

## 2021-08-15 DIAGNOSIS — L821 Other seborrheic keratosis: Secondary | ICD-10-CM | POA: Diagnosis not present

## 2021-08-15 DIAGNOSIS — Z85828 Personal history of other malignant neoplasm of skin: Secondary | ICD-10-CM | POA: Diagnosis not present

## 2021-08-15 DIAGNOSIS — D1801 Hemangioma of skin and subcutaneous tissue: Secondary | ICD-10-CM | POA: Diagnosis not present

## 2021-08-15 DIAGNOSIS — L905 Scar conditions and fibrosis of skin: Secondary | ICD-10-CM | POA: Diagnosis not present

## 2021-08-15 DIAGNOSIS — D225 Melanocytic nevi of trunk: Secondary | ICD-10-CM | POA: Diagnosis not present

## 2021-08-15 DIAGNOSIS — L570 Actinic keratosis: Secondary | ICD-10-CM | POA: Diagnosis not present

## 2021-08-15 DIAGNOSIS — C44629 Squamous cell carcinoma of skin of left upper limb, including shoulder: Secondary | ICD-10-CM | POA: Diagnosis not present

## 2021-08-15 DIAGNOSIS — C44722 Squamous cell carcinoma of skin of right lower limb, including hip: Secondary | ICD-10-CM | POA: Diagnosis not present

## 2021-08-23 DIAGNOSIS — M810 Age-related osteoporosis without current pathological fracture: Secondary | ICD-10-CM | POA: Diagnosis not present

## 2021-09-18 DIAGNOSIS — M5416 Radiculopathy, lumbar region: Secondary | ICD-10-CM | POA: Diagnosis not present

## 2021-09-21 DIAGNOSIS — M5136 Other intervertebral disc degeneration, lumbar region: Secondary | ICD-10-CM | POA: Diagnosis not present

## 2021-09-21 DIAGNOSIS — M9903 Segmental and somatic dysfunction of lumbar region: Secondary | ICD-10-CM | POA: Diagnosis not present

## 2021-09-21 DIAGNOSIS — M9904 Segmental and somatic dysfunction of sacral region: Secondary | ICD-10-CM | POA: Diagnosis not present

## 2021-09-21 DIAGNOSIS — M9905 Segmental and somatic dysfunction of pelvic region: Secondary | ICD-10-CM | POA: Diagnosis not present

## 2021-09-26 DIAGNOSIS — M9904 Segmental and somatic dysfunction of sacral region: Secondary | ICD-10-CM | POA: Diagnosis not present

## 2021-09-26 DIAGNOSIS — M9903 Segmental and somatic dysfunction of lumbar region: Secondary | ICD-10-CM | POA: Diagnosis not present

## 2021-09-26 DIAGNOSIS — M9905 Segmental and somatic dysfunction of pelvic region: Secondary | ICD-10-CM | POA: Diagnosis not present

## 2021-09-26 DIAGNOSIS — M5136 Other intervertebral disc degeneration, lumbar region: Secondary | ICD-10-CM | POA: Diagnosis not present

## 2021-10-03 DIAGNOSIS — M9904 Segmental and somatic dysfunction of sacral region: Secondary | ICD-10-CM | POA: Diagnosis not present

## 2021-10-03 DIAGNOSIS — M9905 Segmental and somatic dysfunction of pelvic region: Secondary | ICD-10-CM | POA: Diagnosis not present

## 2021-10-03 DIAGNOSIS — M9903 Segmental and somatic dysfunction of lumbar region: Secondary | ICD-10-CM | POA: Diagnosis not present

## 2021-10-03 DIAGNOSIS — M5136 Other intervertebral disc degeneration, lumbar region: Secondary | ICD-10-CM | POA: Diagnosis not present

## 2021-10-05 DIAGNOSIS — M9905 Segmental and somatic dysfunction of pelvic region: Secondary | ICD-10-CM | POA: Diagnosis not present

## 2021-10-05 DIAGNOSIS — M9904 Segmental and somatic dysfunction of sacral region: Secondary | ICD-10-CM | POA: Diagnosis not present

## 2021-10-05 DIAGNOSIS — M9903 Segmental and somatic dysfunction of lumbar region: Secondary | ICD-10-CM | POA: Diagnosis not present

## 2021-10-05 DIAGNOSIS — M5136 Other intervertebral disc degeneration, lumbar region: Secondary | ICD-10-CM | POA: Diagnosis not present

## 2021-10-09 DIAGNOSIS — M9905 Segmental and somatic dysfunction of pelvic region: Secondary | ICD-10-CM | POA: Diagnosis not present

## 2021-10-09 DIAGNOSIS — M9904 Segmental and somatic dysfunction of sacral region: Secondary | ICD-10-CM | POA: Diagnosis not present

## 2021-10-09 DIAGNOSIS — M9903 Segmental and somatic dysfunction of lumbar region: Secondary | ICD-10-CM | POA: Diagnosis not present

## 2021-10-09 DIAGNOSIS — M5136 Other intervertebral disc degeneration, lumbar region: Secondary | ICD-10-CM | POA: Diagnosis not present

## 2021-10-12 DIAGNOSIS — M5136 Other intervertebral disc degeneration, lumbar region: Secondary | ICD-10-CM | POA: Diagnosis not present

## 2021-10-12 DIAGNOSIS — M9904 Segmental and somatic dysfunction of sacral region: Secondary | ICD-10-CM | POA: Diagnosis not present

## 2021-10-12 DIAGNOSIS — M9903 Segmental and somatic dysfunction of lumbar region: Secondary | ICD-10-CM | POA: Diagnosis not present

## 2021-10-12 DIAGNOSIS — M9905 Segmental and somatic dysfunction of pelvic region: Secondary | ICD-10-CM | POA: Diagnosis not present

## 2021-10-17 DIAGNOSIS — M5136 Other intervertebral disc degeneration, lumbar region: Secondary | ICD-10-CM | POA: Diagnosis not present

## 2021-10-17 DIAGNOSIS — M9904 Segmental and somatic dysfunction of sacral region: Secondary | ICD-10-CM | POA: Diagnosis not present

## 2021-10-17 DIAGNOSIS — M9905 Segmental and somatic dysfunction of pelvic region: Secondary | ICD-10-CM | POA: Diagnosis not present

## 2021-10-17 DIAGNOSIS — M9903 Segmental and somatic dysfunction of lumbar region: Secondary | ICD-10-CM | POA: Diagnosis not present

## 2021-10-23 DIAGNOSIS — M9905 Segmental and somatic dysfunction of pelvic region: Secondary | ICD-10-CM | POA: Diagnosis not present

## 2021-10-23 DIAGNOSIS — M5136 Other intervertebral disc degeneration, lumbar region: Secondary | ICD-10-CM | POA: Diagnosis not present

## 2021-10-23 DIAGNOSIS — M9903 Segmental and somatic dysfunction of lumbar region: Secondary | ICD-10-CM | POA: Diagnosis not present

## 2021-10-23 DIAGNOSIS — M9904 Segmental and somatic dysfunction of sacral region: Secondary | ICD-10-CM | POA: Diagnosis not present

## 2021-10-30 DIAGNOSIS — M5136 Other intervertebral disc degeneration, lumbar region: Secondary | ICD-10-CM | POA: Diagnosis not present

## 2021-10-30 DIAGNOSIS — M9903 Segmental and somatic dysfunction of lumbar region: Secondary | ICD-10-CM | POA: Diagnosis not present

## 2021-10-30 DIAGNOSIS — M9905 Segmental and somatic dysfunction of pelvic region: Secondary | ICD-10-CM | POA: Diagnosis not present

## 2021-10-30 DIAGNOSIS — M9904 Segmental and somatic dysfunction of sacral region: Secondary | ICD-10-CM | POA: Diagnosis not present

## 2021-11-06 DIAGNOSIS — M9904 Segmental and somatic dysfunction of sacral region: Secondary | ICD-10-CM | POA: Diagnosis not present

## 2021-11-06 DIAGNOSIS — M9905 Segmental and somatic dysfunction of pelvic region: Secondary | ICD-10-CM | POA: Diagnosis not present

## 2021-11-06 DIAGNOSIS — M5136 Other intervertebral disc degeneration, lumbar region: Secondary | ICD-10-CM | POA: Diagnosis not present

## 2021-11-06 DIAGNOSIS — M9903 Segmental and somatic dysfunction of lumbar region: Secondary | ICD-10-CM | POA: Diagnosis not present

## 2021-11-09 DIAGNOSIS — M9904 Segmental and somatic dysfunction of sacral region: Secondary | ICD-10-CM | POA: Diagnosis not present

## 2021-11-09 DIAGNOSIS — M9903 Segmental and somatic dysfunction of lumbar region: Secondary | ICD-10-CM | POA: Diagnosis not present

## 2021-11-09 DIAGNOSIS — M9905 Segmental and somatic dysfunction of pelvic region: Secondary | ICD-10-CM | POA: Diagnosis not present

## 2021-11-09 DIAGNOSIS — M5136 Other intervertebral disc degeneration, lumbar region: Secondary | ICD-10-CM | POA: Diagnosis not present

## 2021-11-21 DIAGNOSIS — M5136 Other intervertebral disc degeneration, lumbar region: Secondary | ICD-10-CM | POA: Diagnosis not present

## 2021-11-21 DIAGNOSIS — M9905 Segmental and somatic dysfunction of pelvic region: Secondary | ICD-10-CM | POA: Diagnosis not present

## 2021-11-21 DIAGNOSIS — M9904 Segmental and somatic dysfunction of sacral region: Secondary | ICD-10-CM | POA: Diagnosis not present

## 2021-11-21 DIAGNOSIS — M9903 Segmental and somatic dysfunction of lumbar region: Secondary | ICD-10-CM | POA: Diagnosis not present

## 2021-12-25 DIAGNOSIS — Z23 Encounter for immunization: Secondary | ICD-10-CM | POA: Diagnosis not present

## 2022-01-11 DIAGNOSIS — M5136 Other intervertebral disc degeneration, lumbar region: Secondary | ICD-10-CM | POA: Diagnosis not present

## 2022-01-11 DIAGNOSIS — M9903 Segmental and somatic dysfunction of lumbar region: Secondary | ICD-10-CM | POA: Diagnosis not present

## 2022-01-11 DIAGNOSIS — M9905 Segmental and somatic dysfunction of pelvic region: Secondary | ICD-10-CM | POA: Diagnosis not present

## 2022-01-11 DIAGNOSIS — M9904 Segmental and somatic dysfunction of sacral region: Secondary | ICD-10-CM | POA: Diagnosis not present

## 2022-01-16 DIAGNOSIS — M9905 Segmental and somatic dysfunction of pelvic region: Secondary | ICD-10-CM | POA: Diagnosis not present

## 2022-01-16 DIAGNOSIS — M5136 Other intervertebral disc degeneration, lumbar region: Secondary | ICD-10-CM | POA: Diagnosis not present

## 2022-01-16 DIAGNOSIS — M9904 Segmental and somatic dysfunction of sacral region: Secondary | ICD-10-CM | POA: Diagnosis not present

## 2022-01-16 DIAGNOSIS — M9903 Segmental and somatic dysfunction of lumbar region: Secondary | ICD-10-CM | POA: Diagnosis not present

## 2022-01-18 DIAGNOSIS — M5416 Radiculopathy, lumbar region: Secondary | ICD-10-CM | POA: Diagnosis not present

## 2022-01-23 DIAGNOSIS — M9905 Segmental and somatic dysfunction of pelvic region: Secondary | ICD-10-CM | POA: Diagnosis not present

## 2022-01-23 DIAGNOSIS — M9903 Segmental and somatic dysfunction of lumbar region: Secondary | ICD-10-CM | POA: Diagnosis not present

## 2022-01-23 DIAGNOSIS — M5136 Other intervertebral disc degeneration, lumbar region: Secondary | ICD-10-CM | POA: Diagnosis not present

## 2022-01-23 DIAGNOSIS — M9904 Segmental and somatic dysfunction of sacral region: Secondary | ICD-10-CM | POA: Diagnosis not present

## 2022-01-29 DIAGNOSIS — M810 Age-related osteoporosis without current pathological fracture: Secondary | ICD-10-CM | POA: Diagnosis not present

## 2022-01-29 DIAGNOSIS — M5136 Other intervertebral disc degeneration, lumbar region: Secondary | ICD-10-CM | POA: Diagnosis not present

## 2022-01-29 DIAGNOSIS — I10 Essential (primary) hypertension: Secondary | ICD-10-CM | POA: Diagnosis not present

## 2022-01-29 DIAGNOSIS — E7800 Pure hypercholesterolemia, unspecified: Secondary | ICD-10-CM | POA: Diagnosis not present

## 2022-01-29 DIAGNOSIS — M9905 Segmental and somatic dysfunction of pelvic region: Secondary | ICD-10-CM | POA: Diagnosis not present

## 2022-01-29 DIAGNOSIS — R739 Hyperglycemia, unspecified: Secondary | ICD-10-CM | POA: Diagnosis not present

## 2022-01-29 DIAGNOSIS — E890 Postprocedural hypothyroidism: Secondary | ICD-10-CM | POA: Diagnosis not present

## 2022-01-29 DIAGNOSIS — M9904 Segmental and somatic dysfunction of sacral region: Secondary | ICD-10-CM | POA: Diagnosis not present

## 2022-01-29 DIAGNOSIS — E559 Vitamin D deficiency, unspecified: Secondary | ICD-10-CM | POA: Diagnosis not present

## 2022-01-29 DIAGNOSIS — M9903 Segmental and somatic dysfunction of lumbar region: Secondary | ICD-10-CM | POA: Diagnosis not present

## 2022-02-01 DIAGNOSIS — M5416 Radiculopathy, lumbar region: Secondary | ICD-10-CM | POA: Diagnosis not present

## 2022-02-02 DIAGNOSIS — I10 Essential (primary) hypertension: Secondary | ICD-10-CM | POA: Diagnosis not present

## 2022-02-02 DIAGNOSIS — E559 Vitamin D deficiency, unspecified: Secondary | ICD-10-CM | POA: Diagnosis not present

## 2022-02-02 DIAGNOSIS — M810 Age-related osteoporosis without current pathological fracture: Secondary | ICD-10-CM | POA: Diagnosis not present

## 2022-02-02 DIAGNOSIS — E890 Postprocedural hypothyroidism: Secondary | ICD-10-CM | POA: Diagnosis not present

## 2022-02-05 DIAGNOSIS — R739 Hyperglycemia, unspecified: Secondary | ICD-10-CM | POA: Diagnosis not present

## 2022-02-05 DIAGNOSIS — E559 Vitamin D deficiency, unspecified: Secondary | ICD-10-CM | POA: Diagnosis not present

## 2022-02-05 DIAGNOSIS — M5116 Intervertebral disc disorders with radiculopathy, lumbar region: Secondary | ICD-10-CM | POA: Diagnosis not present

## 2022-02-05 DIAGNOSIS — E890 Postprocedural hypothyroidism: Secondary | ICD-10-CM | POA: Diagnosis not present

## 2022-02-05 DIAGNOSIS — I10 Essential (primary) hypertension: Secondary | ICD-10-CM | POA: Diagnosis not present

## 2022-02-05 DIAGNOSIS — M810 Age-related osteoporosis without current pathological fracture: Secondary | ICD-10-CM | POA: Diagnosis not present

## 2022-02-05 DIAGNOSIS — E7800 Pure hypercholesterolemia, unspecified: Secondary | ICD-10-CM | POA: Diagnosis not present

## 2022-02-05 DIAGNOSIS — Z0000 Encounter for general adult medical examination without abnormal findings: Secondary | ICD-10-CM | POA: Diagnosis not present

## 2022-02-05 DIAGNOSIS — Z85828 Personal history of other malignant neoplasm of skin: Secondary | ICD-10-CM | POA: Diagnosis not present

## 2022-02-06 DIAGNOSIS — M9903 Segmental and somatic dysfunction of lumbar region: Secondary | ICD-10-CM | POA: Diagnosis not present

## 2022-02-06 DIAGNOSIS — M9904 Segmental and somatic dysfunction of sacral region: Secondary | ICD-10-CM | POA: Diagnosis not present

## 2022-02-06 DIAGNOSIS — M9905 Segmental and somatic dysfunction of pelvic region: Secondary | ICD-10-CM | POA: Diagnosis not present

## 2022-02-06 DIAGNOSIS — M5136 Other intervertebral disc degeneration, lumbar region: Secondary | ICD-10-CM | POA: Diagnosis not present

## 2022-02-16 DIAGNOSIS — D2371 Other benign neoplasm of skin of right lower limb, including hip: Secondary | ICD-10-CM | POA: Diagnosis not present

## 2022-02-16 DIAGNOSIS — L820 Inflamed seborrheic keratosis: Secondary | ICD-10-CM | POA: Diagnosis not present

## 2022-02-16 DIAGNOSIS — L814 Other melanin hyperpigmentation: Secondary | ICD-10-CM | POA: Diagnosis not present

## 2022-02-16 DIAGNOSIS — L821 Other seborrheic keratosis: Secondary | ICD-10-CM | POA: Diagnosis not present

## 2022-02-16 DIAGNOSIS — Z85828 Personal history of other malignant neoplasm of skin: Secondary | ICD-10-CM | POA: Diagnosis not present

## 2022-02-16 DIAGNOSIS — D1801 Hemangioma of skin and subcutaneous tissue: Secondary | ICD-10-CM | POA: Diagnosis not present

## 2022-02-16 DIAGNOSIS — L570 Actinic keratosis: Secondary | ICD-10-CM | POA: Diagnosis not present

## 2022-02-16 DIAGNOSIS — D485 Neoplasm of uncertain behavior of skin: Secondary | ICD-10-CM | POA: Diagnosis not present

## 2022-02-16 DIAGNOSIS — L309 Dermatitis, unspecified: Secondary | ICD-10-CM | POA: Diagnosis not present

## 2022-02-16 DIAGNOSIS — D2262 Melanocytic nevi of left upper limb, including shoulder: Secondary | ICD-10-CM | POA: Diagnosis not present

## 2022-02-20 DIAGNOSIS — M9904 Segmental and somatic dysfunction of sacral region: Secondary | ICD-10-CM | POA: Diagnosis not present

## 2022-02-20 DIAGNOSIS — M5136 Other intervertebral disc degeneration, lumbar region: Secondary | ICD-10-CM | POA: Diagnosis not present

## 2022-02-20 DIAGNOSIS — M9905 Segmental and somatic dysfunction of pelvic region: Secondary | ICD-10-CM | POA: Diagnosis not present

## 2022-02-20 DIAGNOSIS — M9903 Segmental and somatic dysfunction of lumbar region: Secondary | ICD-10-CM | POA: Diagnosis not present

## 2022-02-26 DIAGNOSIS — M810 Age-related osteoporosis without current pathological fracture: Secondary | ICD-10-CM | POA: Diagnosis not present

## 2022-03-07 DIAGNOSIS — M5136 Other intervertebral disc degeneration, lumbar region: Secondary | ICD-10-CM | POA: Diagnosis not present

## 2022-03-07 DIAGNOSIS — M9904 Segmental and somatic dysfunction of sacral region: Secondary | ICD-10-CM | POA: Diagnosis not present

## 2022-03-07 DIAGNOSIS — M9903 Segmental and somatic dysfunction of lumbar region: Secondary | ICD-10-CM | POA: Diagnosis not present

## 2022-03-07 DIAGNOSIS — M9905 Segmental and somatic dysfunction of pelvic region: Secondary | ICD-10-CM | POA: Diagnosis not present

## 2022-03-27 DIAGNOSIS — M9903 Segmental and somatic dysfunction of lumbar region: Secondary | ICD-10-CM | POA: Diagnosis not present

## 2022-03-27 DIAGNOSIS — M9904 Segmental and somatic dysfunction of sacral region: Secondary | ICD-10-CM | POA: Diagnosis not present

## 2022-03-27 DIAGNOSIS — M5136 Other intervertebral disc degeneration, lumbar region: Secondary | ICD-10-CM | POA: Diagnosis not present

## 2022-03-27 DIAGNOSIS — M9905 Segmental and somatic dysfunction of pelvic region: Secondary | ICD-10-CM | POA: Diagnosis not present

## 2022-04-12 DIAGNOSIS — M9903 Segmental and somatic dysfunction of lumbar region: Secondary | ICD-10-CM | POA: Diagnosis not present

## 2022-04-12 DIAGNOSIS — M5136 Other intervertebral disc degeneration, lumbar region: Secondary | ICD-10-CM | POA: Diagnosis not present

## 2022-04-12 DIAGNOSIS — M9904 Segmental and somatic dysfunction of sacral region: Secondary | ICD-10-CM | POA: Diagnosis not present

## 2022-04-12 DIAGNOSIS — M9905 Segmental and somatic dysfunction of pelvic region: Secondary | ICD-10-CM | POA: Diagnosis not present

## 2022-05-14 DIAGNOSIS — M9904 Segmental and somatic dysfunction of sacral region: Secondary | ICD-10-CM | POA: Diagnosis not present

## 2022-05-14 DIAGNOSIS — M5136 Other intervertebral disc degeneration, lumbar region: Secondary | ICD-10-CM | POA: Diagnosis not present

## 2022-05-14 DIAGNOSIS — M9905 Segmental and somatic dysfunction of pelvic region: Secondary | ICD-10-CM | POA: Diagnosis not present

## 2022-05-14 DIAGNOSIS — M9903 Segmental and somatic dysfunction of lumbar region: Secondary | ICD-10-CM | POA: Diagnosis not present

## 2022-06-21 DIAGNOSIS — M9905 Segmental and somatic dysfunction of pelvic region: Secondary | ICD-10-CM | POA: Diagnosis not present

## 2022-06-21 DIAGNOSIS — M9903 Segmental and somatic dysfunction of lumbar region: Secondary | ICD-10-CM | POA: Diagnosis not present

## 2022-06-21 DIAGNOSIS — M5136 Other intervertebral disc degeneration, lumbar region: Secondary | ICD-10-CM | POA: Diagnosis not present

## 2022-06-21 DIAGNOSIS — M9904 Segmental and somatic dysfunction of sacral region: Secondary | ICD-10-CM | POA: Diagnosis not present

## 2022-07-10 DIAGNOSIS — Z1231 Encounter for screening mammogram for malignant neoplasm of breast: Secondary | ICD-10-CM | POA: Diagnosis not present

## 2022-07-17 DIAGNOSIS — M792 Neuralgia and neuritis, unspecified: Secondary | ICD-10-CM | POA: Diagnosis not present

## 2022-07-17 DIAGNOSIS — M5416 Radiculopathy, lumbar region: Secondary | ICD-10-CM | POA: Diagnosis not present

## 2022-07-24 DIAGNOSIS — M792 Neuralgia and neuritis, unspecified: Secondary | ICD-10-CM | POA: Diagnosis not present

## 2022-08-16 DIAGNOSIS — H0102B Squamous blepharitis left eye, upper and lower eyelids: Secondary | ICD-10-CM | POA: Diagnosis not present

## 2022-08-16 DIAGNOSIS — H35033 Hypertensive retinopathy, bilateral: Secondary | ICD-10-CM | POA: Diagnosis not present

## 2022-08-16 DIAGNOSIS — H25813 Combined forms of age-related cataract, bilateral: Secondary | ICD-10-CM | POA: Diagnosis not present

## 2022-08-16 DIAGNOSIS — H11153 Pinguecula, bilateral: Secondary | ICD-10-CM | POA: Diagnosis not present

## 2022-08-16 DIAGNOSIS — H04123 Dry eye syndrome of bilateral lacrimal glands: Secondary | ICD-10-CM | POA: Diagnosis not present

## 2022-08-16 DIAGNOSIS — H0102A Squamous blepharitis right eye, upper and lower eyelids: Secondary | ICD-10-CM | POA: Diagnosis not present

## 2022-08-30 DIAGNOSIS — M9903 Segmental and somatic dysfunction of lumbar region: Secondary | ICD-10-CM | POA: Diagnosis not present

## 2022-08-30 DIAGNOSIS — M9904 Segmental and somatic dysfunction of sacral region: Secondary | ICD-10-CM | POA: Diagnosis not present

## 2022-08-30 DIAGNOSIS — M5136 Other intervertebral disc degeneration, lumbar region: Secondary | ICD-10-CM | POA: Diagnosis not present

## 2022-08-30 DIAGNOSIS — M9905 Segmental and somatic dysfunction of pelvic region: Secondary | ICD-10-CM | POA: Diagnosis not present

## 2022-09-04 DIAGNOSIS — M810 Age-related osteoporosis without current pathological fracture: Secondary | ICD-10-CM | POA: Diagnosis not present

## 2022-11-06 DIAGNOSIS — M9905 Segmental and somatic dysfunction of pelvic region: Secondary | ICD-10-CM | POA: Diagnosis not present

## 2022-11-06 DIAGNOSIS — M5136 Other intervertebral disc degeneration, lumbar region: Secondary | ICD-10-CM | POA: Diagnosis not present

## 2022-11-06 DIAGNOSIS — M9903 Segmental and somatic dysfunction of lumbar region: Secondary | ICD-10-CM | POA: Diagnosis not present

## 2022-11-06 DIAGNOSIS — M9904 Segmental and somatic dysfunction of sacral region: Secondary | ICD-10-CM | POA: Diagnosis not present

## 2022-11-14 DIAGNOSIS — M5136 Other intervertebral disc degeneration, lumbar region: Secondary | ICD-10-CM | POA: Diagnosis not present

## 2022-11-14 DIAGNOSIS — M9904 Segmental and somatic dysfunction of sacral region: Secondary | ICD-10-CM | POA: Diagnosis not present

## 2022-11-14 DIAGNOSIS — M9903 Segmental and somatic dysfunction of lumbar region: Secondary | ICD-10-CM | POA: Diagnosis not present

## 2022-11-14 DIAGNOSIS — M9905 Segmental and somatic dysfunction of pelvic region: Secondary | ICD-10-CM | POA: Diagnosis not present

## 2023-01-02 DIAGNOSIS — M5136 Other intervertebral disc degeneration, lumbar region: Secondary | ICD-10-CM | POA: Diagnosis not present

## 2023-01-02 DIAGNOSIS — M9904 Segmental and somatic dysfunction of sacral region: Secondary | ICD-10-CM | POA: Diagnosis not present

## 2023-01-02 DIAGNOSIS — M9905 Segmental and somatic dysfunction of pelvic region: Secondary | ICD-10-CM | POA: Diagnosis not present

## 2023-01-02 DIAGNOSIS — M9903 Segmental and somatic dysfunction of lumbar region: Secondary | ICD-10-CM | POA: Diagnosis not present

## 2023-02-04 DIAGNOSIS — I10 Essential (primary) hypertension: Secondary | ICD-10-CM | POA: Diagnosis not present

## 2023-02-04 DIAGNOSIS — E890 Postprocedural hypothyroidism: Secondary | ICD-10-CM | POA: Diagnosis not present

## 2023-02-04 DIAGNOSIS — E7800 Pure hypercholesterolemia, unspecified: Secondary | ICD-10-CM | POA: Diagnosis not present

## 2023-02-04 DIAGNOSIS — R739 Hyperglycemia, unspecified: Secondary | ICD-10-CM | POA: Diagnosis not present

## 2023-02-04 DIAGNOSIS — E559 Vitamin D deficiency, unspecified: Secondary | ICD-10-CM | POA: Diagnosis not present

## 2023-02-04 DIAGNOSIS — M810 Age-related osteoporosis without current pathological fracture: Secondary | ICD-10-CM | POA: Diagnosis not present

## 2023-02-11 DIAGNOSIS — E890 Postprocedural hypothyroidism: Secondary | ICD-10-CM | POA: Diagnosis not present

## 2023-02-11 DIAGNOSIS — M5116 Intervertebral disc disorders with radiculopathy, lumbar region: Secondary | ICD-10-CM | POA: Diagnosis not present

## 2023-02-11 DIAGNOSIS — Z79899 Other long term (current) drug therapy: Secondary | ICD-10-CM | POA: Diagnosis not present

## 2023-02-11 DIAGNOSIS — E559 Vitamin D deficiency, unspecified: Secondary | ICD-10-CM | POA: Diagnosis not present

## 2023-02-11 DIAGNOSIS — I10 Essential (primary) hypertension: Secondary | ICD-10-CM | POA: Diagnosis not present

## 2023-02-11 DIAGNOSIS — M810 Age-related osteoporosis without current pathological fracture: Secondary | ICD-10-CM | POA: Diagnosis not present

## 2023-02-11 DIAGNOSIS — Z23 Encounter for immunization: Secondary | ICD-10-CM | POA: Diagnosis not present

## 2023-02-11 DIAGNOSIS — Z0000 Encounter for general adult medical examination without abnormal findings: Secondary | ICD-10-CM | POA: Diagnosis not present

## 2023-02-19 DIAGNOSIS — L905 Scar conditions and fibrosis of skin: Secondary | ICD-10-CM | POA: Diagnosis not present

## 2023-02-19 DIAGNOSIS — Z85828 Personal history of other malignant neoplasm of skin: Secondary | ICD-10-CM | POA: Diagnosis not present

## 2023-02-19 DIAGNOSIS — L570 Actinic keratosis: Secondary | ICD-10-CM | POA: Diagnosis not present

## 2023-02-19 DIAGNOSIS — L821 Other seborrheic keratosis: Secondary | ICD-10-CM | POA: Diagnosis not present

## 2023-02-19 DIAGNOSIS — C44629 Squamous cell carcinoma of skin of left upper limb, including shoulder: Secondary | ICD-10-CM | POA: Diagnosis not present

## 2023-02-19 DIAGNOSIS — L814 Other melanin hyperpigmentation: Secondary | ICD-10-CM | POA: Diagnosis not present

## 2023-02-19 DIAGNOSIS — D485 Neoplasm of uncertain behavior of skin: Secondary | ICD-10-CM | POA: Diagnosis not present

## 2023-02-21 DIAGNOSIS — M9904 Segmental and somatic dysfunction of sacral region: Secondary | ICD-10-CM | POA: Diagnosis not present

## 2023-02-21 DIAGNOSIS — M9903 Segmental and somatic dysfunction of lumbar region: Secondary | ICD-10-CM | POA: Diagnosis not present

## 2023-02-21 DIAGNOSIS — M9905 Segmental and somatic dysfunction of pelvic region: Secondary | ICD-10-CM | POA: Diagnosis not present

## 2023-02-21 DIAGNOSIS — M51361 Other intervertebral disc degeneration, lumbar region with lower extremity pain only: Secondary | ICD-10-CM | POA: Diagnosis not present

## 2023-03-11 DIAGNOSIS — M810 Age-related osteoporosis without current pathological fracture: Secondary | ICD-10-CM | POA: Diagnosis not present

## 2023-04-11 DIAGNOSIS — H6120 Impacted cerumen, unspecified ear: Secondary | ICD-10-CM | POA: Diagnosis not present

## 2023-04-11 DIAGNOSIS — J988 Other specified respiratory disorders: Secondary | ICD-10-CM | POA: Diagnosis not present

## 2023-04-11 DIAGNOSIS — W19XXXA Unspecified fall, initial encounter: Secondary | ICD-10-CM | POA: Diagnosis not present

## 2023-04-11 DIAGNOSIS — R2689 Other abnormalities of gait and mobility: Secondary | ICD-10-CM | POA: Diagnosis not present

## 2023-04-16 DIAGNOSIS — M9904 Segmental and somatic dysfunction of sacral region: Secondary | ICD-10-CM | POA: Diagnosis not present

## 2023-04-16 DIAGNOSIS — M51361 Other intervertebral disc degeneration, lumbar region with lower extremity pain only: Secondary | ICD-10-CM | POA: Diagnosis not present

## 2023-04-16 DIAGNOSIS — M9905 Segmental and somatic dysfunction of pelvic region: Secondary | ICD-10-CM | POA: Diagnosis not present

## 2023-04-16 DIAGNOSIS — M9903 Segmental and somatic dysfunction of lumbar region: Secondary | ICD-10-CM | POA: Diagnosis not present

## 2023-05-28 DIAGNOSIS — M6281 Muscle weakness (generalized): Secondary | ICD-10-CM | POA: Diagnosis not present

## 2023-05-28 DIAGNOSIS — Z7409 Other reduced mobility: Secondary | ICD-10-CM | POA: Diagnosis not present

## 2023-05-28 DIAGNOSIS — R2681 Unsteadiness on feet: Secondary | ICD-10-CM | POA: Diagnosis not present

## 2023-05-31 DIAGNOSIS — Z7409 Other reduced mobility: Secondary | ICD-10-CM | POA: Diagnosis not present

## 2023-05-31 DIAGNOSIS — M6281 Muscle weakness (generalized): Secondary | ICD-10-CM | POA: Diagnosis not present

## 2023-05-31 DIAGNOSIS — R2681 Unsteadiness on feet: Secondary | ICD-10-CM | POA: Diagnosis not present

## 2023-06-04 DIAGNOSIS — R2681 Unsteadiness on feet: Secondary | ICD-10-CM | POA: Diagnosis not present

## 2023-06-04 DIAGNOSIS — Z7409 Other reduced mobility: Secondary | ICD-10-CM | POA: Diagnosis not present

## 2023-06-04 DIAGNOSIS — M6281 Muscle weakness (generalized): Secondary | ICD-10-CM | POA: Diagnosis not present

## 2023-06-06 DIAGNOSIS — Z7409 Other reduced mobility: Secondary | ICD-10-CM | POA: Diagnosis not present

## 2023-06-06 DIAGNOSIS — M6281 Muscle weakness (generalized): Secondary | ICD-10-CM | POA: Diagnosis not present

## 2023-06-06 DIAGNOSIS — R2681 Unsteadiness on feet: Secondary | ICD-10-CM | POA: Diagnosis not present

## 2023-06-11 DIAGNOSIS — R2681 Unsteadiness on feet: Secondary | ICD-10-CM | POA: Diagnosis not present

## 2023-06-11 DIAGNOSIS — M6281 Muscle weakness (generalized): Secondary | ICD-10-CM | POA: Diagnosis not present

## 2023-06-11 DIAGNOSIS — Z7409 Other reduced mobility: Secondary | ICD-10-CM | POA: Diagnosis not present

## 2023-06-13 DIAGNOSIS — M6281 Muscle weakness (generalized): Secondary | ICD-10-CM | POA: Diagnosis not present

## 2023-06-13 DIAGNOSIS — R2681 Unsteadiness on feet: Secondary | ICD-10-CM | POA: Diagnosis not present

## 2023-06-13 DIAGNOSIS — Z7409 Other reduced mobility: Secondary | ICD-10-CM | POA: Diagnosis not present

## 2023-06-18 DIAGNOSIS — R2681 Unsteadiness on feet: Secondary | ICD-10-CM | POA: Diagnosis not present

## 2023-06-18 DIAGNOSIS — M6281 Muscle weakness (generalized): Secondary | ICD-10-CM | POA: Diagnosis not present

## 2023-06-18 DIAGNOSIS — Z7409 Other reduced mobility: Secondary | ICD-10-CM | POA: Diagnosis not present

## 2023-06-20 DIAGNOSIS — Z7409 Other reduced mobility: Secondary | ICD-10-CM | POA: Diagnosis not present

## 2023-06-20 DIAGNOSIS — R2681 Unsteadiness on feet: Secondary | ICD-10-CM | POA: Diagnosis not present

## 2023-06-20 DIAGNOSIS — M6281 Muscle weakness (generalized): Secondary | ICD-10-CM | POA: Diagnosis not present

## 2023-06-25 DIAGNOSIS — M6281 Muscle weakness (generalized): Secondary | ICD-10-CM | POA: Diagnosis not present

## 2023-06-25 DIAGNOSIS — R2681 Unsteadiness on feet: Secondary | ICD-10-CM | POA: Diagnosis not present

## 2023-06-25 DIAGNOSIS — Z7409 Other reduced mobility: Secondary | ICD-10-CM | POA: Diagnosis not present

## 2023-06-27 DIAGNOSIS — R2681 Unsteadiness on feet: Secondary | ICD-10-CM | POA: Diagnosis not present

## 2023-06-27 DIAGNOSIS — M6281 Muscle weakness (generalized): Secondary | ICD-10-CM | POA: Diagnosis not present

## 2023-06-27 DIAGNOSIS — Z7409 Other reduced mobility: Secondary | ICD-10-CM | POA: Diagnosis not present

## 2023-07-16 DIAGNOSIS — Z1231 Encounter for screening mammogram for malignant neoplasm of breast: Secondary | ICD-10-CM | POA: Diagnosis not present

## 2023-08-27 DIAGNOSIS — H04123 Dry eye syndrome of bilateral lacrimal glands: Secondary | ICD-10-CM | POA: Diagnosis not present

## 2023-08-27 DIAGNOSIS — M9905 Segmental and somatic dysfunction of pelvic region: Secondary | ICD-10-CM | POA: Diagnosis not present

## 2023-08-27 DIAGNOSIS — M9903 Segmental and somatic dysfunction of lumbar region: Secondary | ICD-10-CM | POA: Diagnosis not present

## 2023-08-27 DIAGNOSIS — H35033 Hypertensive retinopathy, bilateral: Secondary | ICD-10-CM | POA: Diagnosis not present

## 2023-08-27 DIAGNOSIS — M9904 Segmental and somatic dysfunction of sacral region: Secondary | ICD-10-CM | POA: Diagnosis not present

## 2023-08-27 DIAGNOSIS — H0102B Squamous blepharitis left eye, upper and lower eyelids: Secondary | ICD-10-CM | POA: Diagnosis not present

## 2023-08-27 DIAGNOSIS — M51361 Other intervertebral disc degeneration, lumbar region with lower extremity pain only: Secondary | ICD-10-CM | POA: Diagnosis not present

## 2023-08-27 DIAGNOSIS — H25813 Combined forms of age-related cataract, bilateral: Secondary | ICD-10-CM | POA: Diagnosis not present

## 2023-08-27 DIAGNOSIS — H0102A Squamous blepharitis right eye, upper and lower eyelids: Secondary | ICD-10-CM | POA: Diagnosis not present

## 2023-08-27 DIAGNOSIS — H11153 Pinguecula, bilateral: Secondary | ICD-10-CM | POA: Diagnosis not present

## 2023-09-04 DIAGNOSIS — M810 Age-related osteoporosis without current pathological fracture: Secondary | ICD-10-CM | POA: Diagnosis not present

## 2023-09-11 DIAGNOSIS — M810 Age-related osteoporosis without current pathological fracture: Secondary | ICD-10-CM | POA: Diagnosis not present

## 2024-02-05 DIAGNOSIS — E890 Postprocedural hypothyroidism: Secondary | ICD-10-CM | POA: Diagnosis not present

## 2024-02-05 DIAGNOSIS — I10 Essential (primary) hypertension: Secondary | ICD-10-CM | POA: Diagnosis not present

## 2024-02-05 DIAGNOSIS — Z13220 Encounter for screening for lipoid disorders: Secondary | ICD-10-CM | POA: Diagnosis not present

## 2024-02-05 DIAGNOSIS — Z79899 Other long term (current) drug therapy: Secondary | ICD-10-CM | POA: Diagnosis not present

## 2024-02-05 DIAGNOSIS — E559 Vitamin D deficiency, unspecified: Secondary | ICD-10-CM | POA: Diagnosis not present

## 2024-02-12 DIAGNOSIS — R739 Hyperglycemia, unspecified: Secondary | ICD-10-CM | POA: Diagnosis not present

## 2024-02-12 DIAGNOSIS — Z79899 Other long term (current) drug therapy: Secondary | ICD-10-CM | POA: Diagnosis not present

## 2024-02-12 DIAGNOSIS — M5116 Intervertebral disc disorders with radiculopathy, lumbar region: Secondary | ICD-10-CM | POA: Diagnosis not present

## 2024-02-12 DIAGNOSIS — E890 Postprocedural hypothyroidism: Secondary | ICD-10-CM | POA: Diagnosis not present

## 2024-02-12 DIAGNOSIS — Z0000 Encounter for general adult medical examination without abnormal findings: Secondary | ICD-10-CM | POA: Diagnosis not present

## 2024-02-12 DIAGNOSIS — I10 Essential (primary) hypertension: Secondary | ICD-10-CM | POA: Diagnosis not present

## 2024-02-12 DIAGNOSIS — M810 Age-related osteoporosis without current pathological fracture: Secondary | ICD-10-CM | POA: Diagnosis not present

## 2024-02-12 DIAGNOSIS — F17200 Nicotine dependence, unspecified, uncomplicated: Secondary | ICD-10-CM | POA: Diagnosis not present

## 2024-02-12 DIAGNOSIS — E559 Vitamin D deficiency, unspecified: Secondary | ICD-10-CM | POA: Diagnosis not present

## 2024-02-19 DIAGNOSIS — D225 Melanocytic nevi of trunk: Secondary | ICD-10-CM | POA: Diagnosis not present

## 2024-02-19 DIAGNOSIS — C44729 Squamous cell carcinoma of skin of left lower limb, including hip: Secondary | ICD-10-CM | POA: Diagnosis not present

## 2024-02-19 DIAGNOSIS — L814 Other melanin hyperpigmentation: Secondary | ICD-10-CM | POA: Diagnosis not present

## 2024-02-19 DIAGNOSIS — Z85828 Personal history of other malignant neoplasm of skin: Secondary | ICD-10-CM | POA: Diagnosis not present

## 2024-02-19 DIAGNOSIS — L570 Actinic keratosis: Secondary | ICD-10-CM | POA: Diagnosis not present

## 2024-02-19 DIAGNOSIS — C44519 Basal cell carcinoma of skin of other part of trunk: Secondary | ICD-10-CM | POA: Diagnosis not present

## 2024-02-19 DIAGNOSIS — L820 Inflamed seborrheic keratosis: Secondary | ICD-10-CM | POA: Diagnosis not present

## 2024-02-19 DIAGNOSIS — L821 Other seborrheic keratosis: Secondary | ICD-10-CM | POA: Diagnosis not present

## 2024-02-19 DIAGNOSIS — D692 Other nonthrombocytopenic purpura: Secondary | ICD-10-CM | POA: Diagnosis not present

## 2024-02-19 DIAGNOSIS — D1801 Hemangioma of skin and subcutaneous tissue: Secondary | ICD-10-CM | POA: Diagnosis not present

## 2024-02-27 DIAGNOSIS — M9903 Segmental and somatic dysfunction of lumbar region: Secondary | ICD-10-CM | POA: Diagnosis not present

## 2024-02-27 DIAGNOSIS — M9904 Segmental and somatic dysfunction of sacral region: Secondary | ICD-10-CM | POA: Diagnosis not present

## 2024-02-27 DIAGNOSIS — M9905 Segmental and somatic dysfunction of pelvic region: Secondary | ICD-10-CM | POA: Diagnosis not present

## 2024-02-27 DIAGNOSIS — M5136 Other intervertebral disc degeneration, lumbar region with discogenic back pain only: Secondary | ICD-10-CM | POA: Diagnosis not present

## 2024-03-02 DIAGNOSIS — M9904 Segmental and somatic dysfunction of sacral region: Secondary | ICD-10-CM | POA: Diagnosis not present

## 2024-03-02 DIAGNOSIS — M9903 Segmental and somatic dysfunction of lumbar region: Secondary | ICD-10-CM | POA: Diagnosis not present

## 2024-03-02 DIAGNOSIS — M9905 Segmental and somatic dysfunction of pelvic region: Secondary | ICD-10-CM | POA: Diagnosis not present

## 2024-03-02 DIAGNOSIS — M5136 Other intervertebral disc degeneration, lumbar region with discogenic back pain only: Secondary | ICD-10-CM | POA: Diagnosis not present

## 2024-03-19 DIAGNOSIS — M810 Age-related osteoporosis without current pathological fracture: Secondary | ICD-10-CM | POA: Diagnosis not present

## 2024-04-28 NOTE — Progress Notes (Signed)
 Case Management Discharge Note        CSN: [id number] DOB: February 09, 1941 Service: General Medicine Location: 671/01  Patient Class: Inpatient  DC Disposition: Skilled Nursing Facility  Discharge DC Disposition: Skilled Nursing Facility Placement Facility Type: Skilled Nursing Facility Skilled Nursing - Assisted Living Facility: [facility] Discharge Transport: Ambulance Discharge Transport Agency Chosen: [facility]  [phone])  Discharge Referrals Patient Preference: Chosen geographical local area/county shared with patient/family: Yes Date chosen geographical local area/county list shared with patient/family: 04/25/24 Patient Preference for Post-Acute Provider Form completed: Yes Case closed, patient/family agree with disposition plan: Yes  Provider confirms patient is stable for discharge to SNF today. Updated Randine with [facility] admissions, who confirms [facility] location can admit patient to room 209. Provided phone number for nurse to call report to facility. Spoke with patient's daughter Graylin Ferries [phone] by phone and with her son Ellieana Dolecki in room. All parties in agreement. Called [facility] at 2:38pm.   Post Acute Placement Status: Coordination complete.   Case Management Coordination Status: Coordination Complete    Carlyon HERO Evetta Ford

## 2024-04-28 NOTE — Discharge Summary (Addendum)
 [facility]  Discharge Summary   Name: Sandra Brock Age: 84 yrs  MRN: [id number] DOB: 08/05/1940  Admit Date: 04/24/2024 Admitting Physician: Zoaib Safdar Rasool, DO  Discharge Date: 04/28/2024 Discharge Physician: Lamar Furnish, MD    Discharge Diagnoses:   Principal Problem:   Displaced intertrochanteric fracture of right femur, initial encounter for closed fracture (CMD) Active Problems:   Closed right hip fracture, initial encounter (CMD)   HTN (hypertension)   HLD (hyperlipidemia)   Vitamin D deficiency   Osteoporosis   Tobacco use   Hypothyroidism   Impaired functional mobility, balance, gait, and endurance Resolved Problems:   Hypocalcemia   Mild hypo-osmolar hyponatremia   Delirium   Leukocytosis   TO DO List at Follow-up for PCP/Specialist:   Key Medication changes: Aspirin 81 mg twice daily  Calcium + Vitamin D supplementation + Vitamin D2 (ergocalciferol) 50,000 units weekly  6 weeks Seroquel PRN at night for delirium/agitation Foleys catheter placed on 04/28/24 for acute retention of urine  Pending labs to follow up on: Parathyroid hormone (PTH) + Vitamin D level + Repeat BMP to monitor sodium trend and assess hyponatremia chronicity Incidental findings requiring follow-up: Monitor Na     Hospital Course:   For full details, please see H&P, progress notes, consult notes and ancillary notes. Briefly, Sandra Brock is a 84 y.o. year old female with a PMH of , who presented with   1.Right Intertrochanteric Femur Fracture s/p ORIF (04/24/24) Orthopedics evaluated the patient in the ED and recommended urgent surgical intervention. She underwent ORIF with IM nail late on 1/16 without complication. Post-op course notable for improving sensation and stable wound site. She remains WBAT. Management: Lovenox for DVT prophylaxis while inpatient Transition to aspirin 81 mg BID at discharge PT/OT consulted; ongoing needs for gait, strength, mobility  training Follow-up with Orthopedics in 2 weeks  2.Delirium (Acute, Postoperative): resolved  On 1/19, she developed acute confusion and visual hallucinations. No evidence of alcohol withdrawal. TSH and B12 normal. Sodium mildly low but not likely the sole cause. No leukocytosis progression suggestive of infection. Workup & Treatment: CXR and UA unremarkable for infection  Delirium precautions implemented Seroquel PRN for agitation Minimized opioid use Sleep hygiene, orientation strategies, PT engaged  3.Hypocalcemia / Vitamin D Deficiency:resolved  Total Ca low (7.1 to 7.9 mg/dL) with normal albumin. Phosphorus normal. Management: Calcium + Vitamin D supplementation Vitamin D2 50,000 units weekly  6 weeks  4.Mild Hypo-osmolar Hyponatremia Possible SIADH: stable  Admission Na = 131. Serum osm 275, urine sodium 133, urine osm 456. Euvolemic on exam. AM cortisol normal. Plan: Monitor sodium trend Avoid hypotonic fluids Repeat BMP outpatient to assess chronicity Avoid Thiazide diuretics   5.Hypertension, Hyperlipidemia, Osteoporosis, Hypothyroidism: stable  Continue all home medications Synthroid continued; TSH normal on admission  6.Tobacco Use Active smoker (4 to 5 cigarettes/day). Nicotine patch offered; recommend cessation counseling.  7.Leukocytosis: Improving  Admission WBC 19.4, likely reactive from trauma/surgery. No evidence of infection; UA/CXR unremarkable   8.Acute retention of urine requiring foleys catheter   8.Constipation multifactorial in the setting of immobilization, pain medication and calcium supplements Medication adjusted   Patient is found stable for discharge today. Cleared by the subspeciality team for discharge. Patient advised to call 911 or visit nearest ER facility for any worsening or relapse of symptoms. Patient advised to adhere to recommended diet and pharmacological treatment. New medications sent to patient pharmacy electronically. Follow  up in subspeciality clinic as per appointment date. Also discussed about the medication  compliance as non-compliance increases the risk of permanent disability, morbidity and mortality. Patient verbalized understanding. Patient was provided space to ask questions and all concerns were addressed.  Labs, diagnosis and plan of care was explained to the patient and the patient verbalizes understanding. Counseled detailed on specific diet and exercises, medication effects including side effects, instruction on how to take medication, compliance and patient verbalized understanding and agreed to it.  Proper medical follow up instructed and patient agreed to follow.   Predictive Model Details        15.3% (Medium)  Factor Value   Calculated 04/28/2024 08:14 14% Braden score 14   Readmission Risk Score v2 Model 8% Latest hemoglobin in last 72 hrs 9 g/dL    8% Latest RDW in last 72 hrs 13.9 %    7% Diagnosis of fluid or electrolyte disorder present    6% Number of hospitalizations in last year 0    The patient's chronic medical conditions were treated accordingly per the patient's home medication regimen except as noted in the plan above and in the medication list below.    Discharge Condition:   Disposition: Patient discharged to Skilled Nursing Facility (CMS Certified) in fair condition.  Diet at discharge:    Dietary Orders  (From admission, onward)               Ordered    Ensure Plus High Protein; Three times daily with Meals  Once       Question Answer Comment  [facility]: Ensure Plus High Protein   Frequency: Three times daily with Meals   Medical Nutrition Management By RD: Yes, Medical Nutrition Management By RD      04/25/24 0207    Adult Diet- Regular  Diet effective now       References:    Medical Nutrition Management (MNM) for Registered Dietitian  Question Answer Comment  Diet type: Regular   Medical Nutrition Management By RD: Yes, Medical Nutrition Management By RD       04/25/24 0103            Activity at Discharge: Ambulate ad lib   Wound 04/24/24 Incision Thigh Anterior;Proximal;Right (Active)  Date First Assessed/Time First Assessed: 04/24/24 2212   Pre-Existing Wound: No  Primary Wound Type: Incision  Location: Thigh  Wound Location Orientation: Anterior;Proximal;Right  Wound Description (Comments): STAPLES AND AQUACEL     Physical Exam at Discharge   BP 102/65 (BP Location: Left arm, Patient Position: Lying)   Pulse 91   Temp 98.5 F (36.9 C) (Oral)   Resp 20   Ht 1.651 m (5' 5)   Wt 56.7 kg (125 lb)   SpO2 93%   BMI 20.80 kg/m  Physical exam on discharge: Vitals: Reviewed Constitutional: Patient is well developed and not in acute distress  HEENT: Head [place] and AT  Eyes: Conjunctiva normal, PERRLA Neck: Supple, No JVD  CVS: Normal rate and rhythm, Nl heart sounds, no M/R/G Lung: Nl effort , Normal breath sounds ,no added sounds  Abdomen : Soft, non tender, non distended ,BS+ MSK: Normal range of motion  Neuro: Alert and oriented to TPP with intact Judgement  Ext: No pedal edema     Discharge Medications:      Medication List     START taking these medications    aspirin 81 mg chewable tablet Chew 1 tablet (81 mg total) 2 (two) times a day.   calcium carbonate-vitamin D3 500 mg (200 mg Ca)-5 mcg (200 units Vit  D) per tablet Take 1 tablet by mouth 2 (two) times a day.   ergocalciferol 1,250 mcg (50,000 unit) capsule Commonly known as: VITAMIN D2 Take 1 capsule (50,000 Units total) by mouth once a week. Start taking on: May 02, 2024   oxyCODONE 2.5 mg split tablet Commonly known as: ROXICODONE Take 1 split tablet (2.5 mg total) by mouth every 8 (eight) hours as needed for severe pain (7-10).   polyethylene glycol 17 gram packet Commonly known as: GLYCOLAX Take 17 g by mouth daily as needed for constipation (1st line).   QUEtiapine 25 mg tablet Commonly known as: SEROquel Take 1 tablet (25 mg total) by  mouth nightly as needed (agitation).       CONTINUE taking these medications    amLODIPine 5 mg tablet Commonly known as: NORVASC Take 5 mg by mouth daily.   CENTRUM WOMEN ORAL Take 1 tablet by mouth daily.   cyanocobalamin 1,000 mcg tablet Commonly known as: VITAMIN B12 Take 1,000 mcg by mouth daily.   gabapentin 300 mg capsule Commonly known as: NEURONTIN Take 300 mg by mouth at noon.   Synthroid 50 mcg tablet Generic drug: levothyroxine Take 50 mcg by mouth every morning.   telmisartan 40 mg Tab tablet Commonly known as: MICARDIS Take 40 mg by mouth daily.   TURMERIC ORAL Take 1 capsule by mouth daily.       STOP taking these medications    calcium carbonate 1250 mg (500 mg calcium) tablet Commonly known as: OS-CAL         Where to Get Your Medications     These medications were sent to [id number] - [place], [place] - [address] AT [facility] - PHONE: [phone] - FAX: [phone]  [address],  [place] [address]    Phone: [phone]  aspirin 81 mg chewable tablet calcium carbonate-vitamin D3 500 mg (200 mg Ca)-5 mcg (200 units Vit D) per tablet ergocalciferol 1,250 mcg (50,000 unit) capsule    Information about where to get these medications is not yet available   Ask your nurse or doctor about these medications oxyCODONE 2.5 mg split tablet polyethylene glycol 17 gram packet QUEtiapine 25 mg tablet     Significant Diagnostic Tests:   LABS:  Lab Results  Component Value Date   WBC 13.60 (H) 04/28/2024   HGB 9.0 (L) 04/28/2024   HCT 26.0 (L) 04/28/2024   PLT 240 04/28/2024   ALT 15 04/24/2024   AST 29 04/24/2024   NA 130 (L) 04/28/2024   K 3.6 04/28/2024   CL 95 (L) 04/28/2024   CREATININE 0.76 04/28/2024   BUN 26 (H) 04/28/2024   CO2 28 04/28/2024   TSH 1.700 04/25/2024   IMAGING:  XR Chest 1 View  Final Result by Juluis Ned Bound, MD (01/16 2024)  Date of Service: 2024-04-24 19:11:00     EXAM:  XR Chest, 1  View.    CLINICAL HISTORY:  fall.     COMPARISON:  None provided.      FINDINGS:    LUNGS AND PLEURA:  The lungs are clear.   No pleural effusion or pneumothorax.    HEART AND MEDIASTINUM:  Mild cardiomegaly.  No congestion.  Aortic calcification with tortuosity noted.    BONES:  No acute osseous abnormality.    IMPRESSION:    Mild cardiomegaly with no focal infiltrates.    Electronically signed by: Juluis Bound, MD on 04/24/2024 08:24:27 PM   [facility]    XR Femur  Minimum 2 Vw Right  Final Result by Juluis Ned Bound, MD (01/16 1947)  Date of Service: 2024-04-24 18:43:00    EXAM:  XR Femur, right, AP and lateral views.    CLINICAL HISTORY:  fall.     COMPARISON:  None provided.      FINDINGS:    BONES:  Acute, comminuted and moderately displaced fracture of proximal right   femur involving its intertrochanteric region noted.  No any other acute fracture or focal osseous lesion.  Mild diffuse osteopenia is noted.    JOINTS:  No dislocation. The joint spaces are normal.    SOFT TISSUES:  The soft tissues are unremarkable.    IMPRESSION:    Acute fracture of right intertrochanteric region. Clinical correlation and   ortho consult is recommended.  No any other acute fracture.    Electronically signed by: Juluis Bound, MD on 04/24/2024 07:47:16 PM   [facility]    XR Pelvis 1-2 Views  Final Result by Juluis Ned Bound, MD (01/16 1924)  Date of Service: 2024-04-24 18:43:00    EXAM:  XR Pelvis, 1  View.    CLINICAL HISTORY:  fall.     COMPARISON:  None provided.      FINDINGS:    BONES:  Acute, comminuted and moderately displaced fracture of proximal right   femur involving its intertrochanteric region noted.  No any other acute fracture or focal osseous lesion.  Mild diffuse osteopenia is noted.    JOINTS:  No dislocation. The joint spaces are normal.    SOFT TISSUES:  The soft tissues are  unremarkable.    IMPRESSION:    Acute fracture of right intertrochanteric region. Clinical correlation and   ortho consult is recommended.  No any other acute fracture.    Electronically signed by: Juluis Bound, MD on 04/24/2024 07:24:40 PM   [facility]     CULTURES:  No results found for this visit on 04/24/24 (from the past week).   Surgeries/Procedures:   Procedures (LRB): INTERTROCHANTERIC HIP FRACTURE FIXATION WITH NAIL (Right)   Consults:   IP CONSULT TO ORTHOPEDIC SURGERY IP CONSULT TO HOSPITALIST   Follow-up Appointments:    No future appointments.     Electronically signed by: Lamar Furnish, MD 04/28/2024 1:56 PM Time spent on discharge: 35 minutes.
